# Patient Record
Sex: Female | Born: 1950
Health system: Southern US, Community
[De-identification: ages and names within clinical notes are randomized; demographics above are authoritative.]

## PROBLEM LIST (undated history)

## (undated) DIAGNOSIS — G8929 Other chronic pain: Secondary | ICD-10-CM

## (undated) DIAGNOSIS — M545 Low back pain, unspecified: Secondary | ICD-10-CM

## (undated) DIAGNOSIS — E7800 Pure hypercholesterolemia, unspecified: Secondary | ICD-10-CM

## (undated) DIAGNOSIS — R21 Rash and other nonspecific skin eruption: Secondary | ICD-10-CM

## (undated) DIAGNOSIS — R011 Cardiac murmur, unspecified: Secondary | ICD-10-CM

## (undated) DIAGNOSIS — Z8489 Family history of other specified conditions: Secondary | ICD-10-CM

## (undated) DIAGNOSIS — F329 Major depressive disorder, single episode, unspecified: Secondary | ICD-10-CM

## (undated) DIAGNOSIS — IMO0001 Reserved for inherently not codable concepts without codable children: Secondary | ICD-10-CM

## (undated) DIAGNOSIS — M1990 Unspecified osteoarthritis, unspecified site: Secondary | ICD-10-CM

## (undated) DIAGNOSIS — J329 Chronic sinusitis, unspecified: Secondary | ICD-10-CM

## (undated) DIAGNOSIS — M48061 Spinal stenosis, lumbar region without neurogenic claudication: Secondary | ICD-10-CM

## (undated) DIAGNOSIS — J449 Chronic obstructive pulmonary disease, unspecified: Secondary | ICD-10-CM

## (undated) DIAGNOSIS — K219 Gastro-esophageal reflux disease without esophagitis: Secondary | ICD-10-CM

## (undated) DIAGNOSIS — F32A Depression, unspecified: Secondary | ICD-10-CM

## (undated) DIAGNOSIS — I499 Cardiac arrhythmia, unspecified: Secondary | ICD-10-CM

## (undated) DIAGNOSIS — G43909 Migraine, unspecified, not intractable, without status migrainosus: Secondary | ICD-10-CM

## (undated) HISTORY — PX: NASAL SINUS SURGERY: SHX719

## (undated) HISTORY — PX: ABDOMINAL HYSTERECTOMY: SHX81

## (undated) HISTORY — PX: WRIST FRACTURE SURGERY: SHX121

## (undated) HISTORY — PX: CARPAL TUNNEL RELEASE: SHX101

## (undated) HISTORY — PX: EYE SURGERY: SHX253

## (undated) HISTORY — PX: FRACTURE SURGERY: SHX138

---

## 2007-03-06 ENCOUNTER — Emergency Department (HOSPITAL_COMMUNITY): Admission: EM | Admit: 2007-03-06 | Discharge: 2007-03-06 | Payer: Self-pay | Admitting: Emergency Medicine

## 2007-04-19 ENCOUNTER — Encounter: Admission: RE | Admit: 2007-04-19 | Discharge: 2007-04-19 | Payer: Self-pay | Admitting: Orthopedic Surgery

## 2007-07-05 ENCOUNTER — Ambulatory Visit: Payer: Self-pay | Admitting: Internal Medicine

## 2007-07-08 ENCOUNTER — Ambulatory Visit: Payer: Self-pay | Admitting: Internal Medicine

## 2009-12-12 ENCOUNTER — Emergency Department: Payer: Self-pay | Admitting: Emergency Medicine

## 2010-09-09 ENCOUNTER — Inpatient Hospital Stay: Payer: Self-pay | Admitting: Unknown Physician Specialty

## 2010-12-10 NOTE — Consult Note (Signed)
Stephanie Powers, Stephanie Powers NO.:  [id number]   MEDICAL RECORD NO.:  [id number]          PATIENT TYPE:  EMS   LOCATION:  ED                           FACILITY:  [facility]   PHYSICIAN:  Madelynn Done, MD  DATE OF BIRTH:  10-02-1950   DATE OF CONSULTATION:  03/06/2007  DATE OF DISCHARGE:                                 CONSULTATION   REQUESTING PHYSICIAN:  Dr. Patrica Duel.   REASON FOR CONSULTATION:  Right displaced distal radius fracture.   HISTORY OF PRESENT ILLNESS:  Stephanie Powers is a 60 year old female who  fell in her home and landed on an outstretched right arm.  She sustained  a closed injury to her right wrist.  The patient presented to the  emergency department with pain and deformity to the right wrist.  I was  consulted for the management of her right wrist fracture.   PAST MEDICAL HISTORY:  She denies any major medical illnesses.   PAST SURGICAL HISTORY:  Bilateral carpal tunnel surgery.   MEDICATIONS:  None listed.   ALLERGIES:  CIPRO.   SOCIAL HISTORY:  She is a 1 pack-a-day smoker.  She lives alone.  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   REVIEW OF SYSTEMS:  No recent illnesses, hospitalizations.   PHYSICAL EXAMINATION:  She is a healthy-appearing white female.  Awake,  alert and oriented to person, place and time.  Temperature of 98.7, heart rate of 88, respirations 18, blood pressure  118/77.  She is in no acute distress.  RIGHT WRIST:  She has obvious deformity to her right wrist.  She has  tenderness to palpation of her distal radius.  No pain with passive  motion of her elbow.  She is able to extend her thumb, make the A-okay  sign, cross her fingers, abduct and adduct the digits.  Her fingertips  are warm, well-perfused.  Good cap refill.  She has a well-healed carpal  tunnel incision.  Sensation light to light touch is present in the tips  of her digits.  No warmth or redness.  No scars on the dorsum of her  hand.   RADIOGRAPHS:  AP, lateral, and oblique films of the right wrist do show  a displaced and comminuted, what appears to be mainly extraarticular  distal radius fracture.  There is some intra-articular extension  dorsally.   IMPRESSION:  Right wrist displaced distal radius fracture.   PROCEDURE:  The patient received intravenous pain medicine and then the  right wrist was anesthetized with a 1% lidocaine hematoma block, 10 mL.  The patient tolerated this well.  Using the finger trap traction, 10  pounds a closed manipulation was performed.  The patient was placed in  well-padded sugar-tong splint.  The patient tolerated this well.   Post reduction radiographs do show improvement in the radial height and  inclination,  and as well as the volar tilt.  She still appears somewhat  ulnar-positive.  We will have to get comparison films.   PLAN:  The patient's wrist was rather unstable.  There were 2-3 attempts  at the manipulation.  She continued to fall off dorsally.  I think there  is an acceptable reduction in the splint today, however, I think it is  an unstable injury, is likely going to require operative intervention.  I think  she would be a candidate for an external fixator versus a  percutaneous skeletal fixation, and possible open reduction and internal  fixation.  The patient will be discharged to home.  She will be  contacted and followed up in the office this week for surgical planning.  Continue with ice and elevation.  No use of the right upper extremity.  Smoking cessation was encouraged encourage.  Vicodin medicine was given  for pain.  She has never had a bone density test.  This is something we  are going to have to follow her during her postoperative care about bone  density and osteoporosis, given the distal radius an insufficiency  fracture.      Madelynn Done, MD  Electronically Signed     FWO/MEDQ  D:  03/06/2007  T:  03/08/2007  Job:  [id number]

## 2011-05-12 LAB — COMPREHENSIVE METABOLIC PANEL
ALT: 24
AST: 23
Albumin: 3.7
Alkaline Phosphatase: 95
Chloride: 107
GFR calc Af Amer: >60
Potassium: 3.7
Sodium: 142
Total Bilirubin: 0.4
Total Protein: 6.6

## 2011-05-12 LAB — DIFFERENTIAL
Basophils Relative: 0
Eosinophils Absolute: 0.1
Eosinophils Relative: 1
Monocytes Absolute: 0.6
Monocytes Relative: 5
Neutro Abs: 7.9 — ABNORMAL HIGH

## 2011-05-12 LAB — APTT: aPTT: 29

## 2011-05-12 LAB — CBC
Platelets: 260
RDW: 13.5
WBC: 10.9 — ABNORMAL HIGH

## 2015-01-15 ENCOUNTER — Encounter (INDEPENDENT_AMBULATORY_CARE_PROVIDER_SITE_OTHER): Payer: Medicare Other | Admitting: Ophthalmology

## 2015-01-15 DIAGNOSIS — H25012 Cortical age-related cataract, left eye: Secondary | ICD-10-CM | POA: Diagnosis not present

## 2015-01-15 DIAGNOSIS — H35373 Puckering of macula, bilateral: Secondary | ICD-10-CM | POA: Diagnosis not present

## 2015-01-15 DIAGNOSIS — H26491 Other secondary cataract, right eye: Secondary | ICD-10-CM | POA: Diagnosis not present

## 2015-01-15 DIAGNOSIS — H43813 Vitreous degeneration, bilateral: Secondary | ICD-10-CM | POA: Diagnosis not present

## 2015-02-05 ENCOUNTER — Other Ambulatory Visit (INDEPENDENT_AMBULATORY_CARE_PROVIDER_SITE_OTHER): Payer: Medicare Other | Admitting: Ophthalmology

## 2015-02-05 DIAGNOSIS — H26491 Other secondary cataract, right eye: Secondary | ICD-10-CM

## 2015-02-12 ENCOUNTER — Ambulatory Visit (INDEPENDENT_AMBULATORY_CARE_PROVIDER_SITE_OTHER): Payer: Medicare Other | Admitting: Ophthalmology

## 2015-02-12 DIAGNOSIS — H2701 Aphakia, right eye: Secondary | ICD-10-CM

## 2015-04-03 ENCOUNTER — Encounter (INDEPENDENT_AMBULATORY_CARE_PROVIDER_SITE_OTHER): Payer: Medicare Other | Admitting: Ophthalmology

## 2015-04-05 NOTE — H&P (Signed)
Stephanie Powers is an 64 y.o. female.   Chief Complaint:painless loss of vision right eye HPI: loss of vision over past year right eye  No past medical history on file.  No past surgical history on file.  No family history on file. Social History:  has no tobacco, alcohol, and drug history on file.  Allergies: Allergies not on file  No prescriptions prior to admission    Review of systems otherwise negative  There were no vitals taken for this visit.  Physical exam: Mental status: oriented x3. Eyes: See eye exam associated with this date of surgery in media tab.  Scanned in by scanning center Ears, Nose, Throat: within normal limits Neck: Within Normal limits General: within normal limits Chest: Within normal limits Breast: deferred Heart: Within normal limits Abdomen: Within normal limits GU: deferred Extremities: within normal limits Skin: within normal limits  Assessment/Plan Preretinal fibrosis right eye Plan: To [facility] for Pars plana vitrectomy, membrane peel, laser, gas injection right eye  Hayden Pedro 04/05/2015, 7:31 AM

## 2015-04-06 ENCOUNTER — Encounter (HOSPITAL_COMMUNITY): Payer: Self-pay | Admitting: Ophthalmology

## 2015-04-06 DIAGNOSIS — H35371 Puckering of macula, right eye: Secondary | ICD-10-CM | POA: Insufficient documentation

## 2015-04-09 ENCOUNTER — Encounter (HOSPITAL_COMMUNITY): Payer: Self-pay | Admitting: *Deleted

## 2015-04-09 NOTE — Progress Notes (Signed)
MS Justice reports no chest pain, gets short of breath with exertion.  Patient having a hard time reading medication bottles, she will bring medications to the hospital with her. Dr Zigmund Daniel instructed her to not bring any medications to the hospital with her.

## 2015-04-10 ENCOUNTER — Encounter (HOSPITAL_COMMUNITY)
Admission: RE | Disposition: A | Discharge status: Home / Self Care | Payer: Self-pay | Source: Ambulatory Visit | Attending: Ophthalmology

## 2015-04-10 ENCOUNTER — Ambulatory Visit (HOSPITAL_COMMUNITY): Payer: Medicare Other | Admitting: Anesthesiology

## 2015-04-10 ENCOUNTER — Encounter (INDEPENDENT_AMBULATORY_CARE_PROVIDER_SITE_OTHER): Payer: Medicare Other | Admitting: Ophthalmology

## 2015-04-10 ENCOUNTER — Ambulatory Visit (HOSPITAL_COMMUNITY)
Admission: RE | Admit: 2015-04-10 | Discharge: 2015-04-11 | Disposition: A | Discharge status: Home / Self Care | Payer: Medicare Other | Source: Ambulatory Visit | Attending: Ophthalmology | Admitting: Ophthalmology

## 2015-04-10 ENCOUNTER — Encounter (HOSPITAL_COMMUNITY): Payer: Self-pay | Admitting: Anesthesiology

## 2015-04-10 DIAGNOSIS — H5060 Mechanical strabismus, unspecified: Secondary | ICD-10-CM | POA: Diagnosis present

## 2015-04-10 DIAGNOSIS — H5461 Unqualified visual loss, right eye, normal vision left eye: Secondary | ICD-10-CM | POA: Diagnosis not present

## 2015-04-10 DIAGNOSIS — H35379 Puckering of macula, unspecified eye: Secondary | ICD-10-CM | POA: Diagnosis present

## 2015-04-10 DIAGNOSIS — H35373 Puckering of macula, bilateral: Secondary | ICD-10-CM

## 2015-04-10 DIAGNOSIS — H43813 Vitreous degeneration, bilateral: Secondary | ICD-10-CM | POA: Diagnosis not present

## 2015-04-10 DIAGNOSIS — H35371 Puckering of macula, right eye: Secondary | ICD-10-CM | POA: Diagnosis not present

## 2015-04-10 HISTORY — DX: Cardiac murmur, unspecified: R01.1

## 2015-04-10 HISTORY — DX: Cardiac arrhythmia, unspecified: I49.9

## 2015-04-10 HISTORY — DX: Chronic obstructive pulmonary disease, unspecified: J44.9

## 2015-04-10 HISTORY — DX: Other chronic pain: G89.29

## 2015-04-10 HISTORY — DX: Migraine, unspecified, not intractable, without status migrainosus: G43.909

## 2015-04-10 HISTORY — PX: GAS/FLUID EXCHANGE: SHX5334

## 2015-04-10 HISTORY — DX: Rash and other nonspecific skin eruption: R21

## 2015-04-10 HISTORY — DX: Gastro-esophageal reflux disease without esophagitis: K21.9

## 2015-04-10 HISTORY — PX: 25 GAUGE PARS PLANA VITRECTOMY WITH 20 GAUGE MVR PORT: SHX6041

## 2015-04-10 HISTORY — DX: Unspecified osteoarthritis, unspecified site: M19.90

## 2015-04-10 HISTORY — DX: Spinal stenosis, lumbar region without neurogenic claudication: M48.061

## 2015-04-10 HISTORY — DX: Pure hypercholesterolemia, unspecified: E78.00

## 2015-04-10 HISTORY — DX: Major depressive disorder, single episode, unspecified: F32.9

## 2015-04-10 HISTORY — DX: Family history of other specified conditions: Z84.89

## 2015-04-10 HISTORY — DX: Reserved for inherently not codable concepts without codable children: IMO0001

## 2015-04-10 HISTORY — DX: Low back pain, unspecified: M54.50

## 2015-04-10 HISTORY — DX: Low back pain: M54.5

## 2015-04-10 HISTORY — PX: LASER PHOTO ABLATION: SHX5942

## 2015-04-10 HISTORY — DX: Chronic sinusitis, unspecified: J32.9

## 2015-04-10 HISTORY — DX: Depression, unspecified: F32.A

## 2015-04-10 HISTORY — PX: MEMBRANE PEEL: SHX5967

## 2015-04-10 LAB — BASIC METABOLIC PANEL
Anion gap: 8 (ref 5–15)
BUN: 9 mg/dL (ref 6–20)
CALCIUM: 9.2 mg/dL (ref 8.9–10.3)
CO2: 25 mmol/L (ref 22–32)
CREATININE: 0.8 mg/dL (ref 0.44–1.00)
Chloride: 107 mmol/L (ref 101–111)
GFR calc Af Amer: >60 mL/min (ref >60)
GFR calc non Af Amer: >60 mL/min (ref >60)
GLUCOSE: 109 mg/dL — AB (ref 65–99)
Potassium: 4.2 mmol/L (ref 3.5–5.1)
Sodium: 140 mmol/L (ref 135–145)

## 2015-04-10 LAB — CBC
HEMATOCRIT: 38.4 % (ref 36.0–46.0)
Hemoglobin: 12.6 g/dL (ref 12.0–15.0)
MCH: 29.7 pg (ref 26.0–34.0)
MCHC: 32.8 g/dL (ref 30.0–36.0)
MCV: 90.6 fL (ref 78.0–100.0)
Platelets: 192 10*3/uL (ref 150–400)
RBC: 4.24 MIL/uL (ref 3.87–5.11)
RDW: 15.1 % (ref 11.5–15.5)
WBC: 7.8 10*3/uL (ref 4.0–10.5)

## 2015-04-10 SURGERY — 25 GAUGE PARS PLANA VITRECTOMY WITH 20 GAUGE MVR PORT
Anesthesia: General | Site: Eye | Laterality: Right

## 2015-04-10 MED ORDER — HYDROCODONE-ACETAMINOPHEN 5-325 MG PO TABS
1.0000 | ORAL_TABLET | ORAL | Status: DC | PRN
  Administered 2015-04-10 (×2): 1 via ORAL
  Administered 2015-04-11: 2 via ORAL
  Filled 2015-04-10: qty 2
  Filled 2015-04-10 (×2): qty 1

## 2015-04-10 MED ORDER — MIDAZOLAM HCL 2 MG/2ML IJ SOLN
INTRAMUSCULAR | Status: AC
  Filled 2015-04-10: qty 4

## 2015-04-10 MED ORDER — ATROPINE SULFATE 1 % OP SOLN
OPHTHALMIC | Status: AC
  Filled 2015-04-10: qty 5

## 2015-04-10 MED ORDER — CEFAZOLIN SODIUM-DEXTROSE 2-3 GM-% IV SOLR
2.0000 g | INTRAVENOUS | Status: AC
  Administered 2015-04-10: 2 g via INTRAVENOUS
  Filled 2015-04-10: qty 50

## 2015-04-10 MED ORDER — LORATADINE 10 MG PO TABS
10.0000 mg | ORAL_TABLET | Freq: Every day | ORAL | Status: DC
  Administered 2015-04-10: 10 mg via ORAL
  Filled 2015-04-10: qty 1

## 2015-04-10 MED ORDER — GATIFLOXACIN 0.5 % OP SOLN
1.0000 [drp] | Freq: Four times a day (QID) | OPHTHALMIC | Status: DC
  Filled 2015-04-10: qty 2.5

## 2015-04-10 MED ORDER — NAPROXEN SODIUM 220 MG PO CAPS: 440.0000 mg | ORAL_CAPSULE | Freq: Two times a day (BID) | ORAL | Status: DC | PRN

## 2015-04-10 MED ORDER — ACETAMINOPHEN 325 MG PO TABS: 325.0000 mg | ORAL_TABLET | ORAL | Status: DC | PRN

## 2015-04-10 MED ORDER — ROCURONIUM BROMIDE 50 MG/5ML IV SOLN
INTRAVENOUS | Status: AC
  Filled 2015-04-10: qty 1

## 2015-04-10 MED ORDER — PHENYLEPHRINE HCL 10 MG/ML IJ SOLN
INTRAMUSCULAR | Status: DC | PRN
  Administered 2015-04-10 (×2): 80 ug via INTRAVENOUS

## 2015-04-10 MED ORDER — ATORVASTATIN CALCIUM 40 MG PO TABS
40.0000 mg | ORAL_TABLET | Freq: Every day | ORAL | Status: DC
  Administered 2015-04-10: 40 mg via ORAL
  Filled 2015-04-10: qty 1

## 2015-04-10 MED ORDER — ALBUTEROL SULFATE (2.5 MG/3ML) 0.083% IN NEBU: 3.0000 mL | INHALATION_SOLUTION | Freq: Four times a day (QID) | RESPIRATORY_TRACT | Status: DC | PRN

## 2015-04-10 MED ORDER — SODIUM HYALURONATE 10 MG/ML IO SOLN
INTRAOCULAR | Status: DC | PRN
  Administered 2015-04-10: 0.85 mL via INTRAOCULAR

## 2015-04-10 MED ORDER — HYDROCODONE-ACETAMINOPHEN 5-325 MG PO TABS: 1.0000 | ORAL_TABLET | Freq: Four times a day (QID) | ORAL | Status: DC | PRN

## 2015-04-10 MED ORDER — PROPOFOL 10 MG/ML IV BOLUS
INTRAVENOUS | Status: AC
  Filled 2015-04-10: qty 20

## 2015-04-10 MED ORDER — BUPIVACAINE HCL (PF) 0.75 % IJ SOLN
INTRAMUSCULAR | Status: DC | PRN
  Administered 2015-04-10: 10 mL

## 2015-04-10 MED ORDER — ACETAMINOPHEN 160 MG/5ML PO SOLN
325.0000 mg | ORAL | Status: DC | PRN
  Filled 2015-04-10: qty 20.3

## 2015-04-10 MED ORDER — SERTRALINE HCL 50 MG PO TABS
125.0000 mg | ORAL_TABLET | Freq: Every day | ORAL | Status: DC
  Administered 2015-04-10: 125 mg via ORAL
  Filled 2015-04-10 (×2): qty 1

## 2015-04-10 MED ORDER — HYPROMELLOSE (GONIOSCOPIC) 2.5 % OP SOLN
OPHTHALMIC | Status: AC
  Filled 2015-04-10: qty 15

## 2015-04-10 MED ORDER — EPINEPHRINE HCL 1 MG/ML IJ SOLN
INTRAMUSCULAR | Status: AC
  Filled 2015-04-10: qty 1

## 2015-04-10 MED ORDER — BSS IO SOLN
INTRAOCULAR | Status: AC
  Filled 2015-04-10: qty 15

## 2015-04-10 MED ORDER — ONDANSETRON HCL 4 MG/2ML IJ SOLN
INTRAMUSCULAR | Status: AC
  Filled 2015-04-10: qty 2

## 2015-04-10 MED ORDER — LIDOCAINE HCL (CARDIAC) 20 MG/ML IV SOLN
INTRAVENOUS | Status: AC
  Filled 2015-04-10: qty 10

## 2015-04-10 MED ORDER — ONDANSETRON HCL 4 MG/2ML IJ SOLN: 4.0000 mg | Freq: Four times a day (QID) | INTRAMUSCULAR | Status: DC | PRN

## 2015-04-10 MED ORDER — TEMAZEPAM 15 MG PO CAPS: 15.0000 mg | ORAL_CAPSULE | Freq: Every evening | ORAL | Status: DC | PRN

## 2015-04-10 MED ORDER — SUCCINYLCHOLINE CHLORIDE 20 MG/ML IJ SOLN
INTRAMUSCULAR | Status: DC | PRN
  Administered 2015-04-10: 40 mg via INTRAVENOUS

## 2015-04-10 MED ORDER — CYCLOPENTOLATE HCL 1 % OP SOLN
1.0000 [drp] | OPHTHALMIC | Status: DC | PRN
  Filled 2015-04-10: qty 2

## 2015-04-10 MED ORDER — PANTOPRAZOLE SODIUM 40 MG PO TBEC
40.0000 mg | DELAYED_RELEASE_TABLET | Freq: Every day | ORAL | Status: DC
  Administered 2015-04-10: 40 mg via ORAL
  Filled 2015-04-10: qty 1

## 2015-04-10 MED ORDER — MIDAZOLAM HCL 5 MG/5ML IJ SOLN
INTRAMUSCULAR | Status: DC | PRN
  Administered 2015-04-10: 2 mg via INTRAVENOUS

## 2015-04-10 MED ORDER — PHENYLEPHRINE HCL 10 % OP SOLN
OPHTHALMIC | Status: AC
  Filled 2015-04-10: qty 5

## 2015-04-10 MED ORDER — GENTAMICIN SULFATE 40 MG/ML IJ SOLN
INTRAMUSCULAR | Status: AC
  Filled 2015-04-10: qty 2

## 2015-04-10 MED ORDER — POLYMYXIN B SULFATE 500000 UNITS IJ SOLR
INTRAMUSCULAR | Status: AC
  Filled 2015-04-10: qty 1

## 2015-04-10 MED ORDER — BACITRACIN-POLYMYXIN B 500-10000 UNIT/GM OP OINT
TOPICAL_OINTMENT | OPHTHALMIC | Status: AC
  Filled 2015-04-10: qty 3.5

## 2015-04-10 MED ORDER — GATIFLOXACIN 0.5 % OP SOLN
1.0000 [drp] | OPHTHALMIC | Status: DC | PRN
  Filled 2015-04-10: qty 2.5

## 2015-04-10 MED ORDER — ACETAZOLAMIDE SODIUM 500 MG IJ SOLR
500.0000 mg | Freq: Once | INTRAMUSCULAR | Status: AC
  Administered 2015-04-11: 500 mg via INTRAVENOUS
  Filled 2015-04-10: qty 500

## 2015-04-10 MED ORDER — FENTANYL CITRATE (PF) 100 MCG/2ML IJ SOLN: 25.0000 ug | INTRAMUSCULAR | Status: DC | PRN

## 2015-04-10 MED ORDER — TRIAMCINOLONE ACETONIDE 0.1 % EX CREA
1.0000 "application " | TOPICAL_CREAM | Freq: Every day | CUTANEOUS | Status: DC
  Filled 2015-04-10: qty 15

## 2015-04-10 MED ORDER — EPINEPHRINE HCL 1 MG/ML IJ SOLN
INTRAOCULAR | Status: DC | PRN
  Administered 2015-04-10: 12:00:00

## 2015-04-10 MED ORDER — HYALURONIDASE HUMAN 150 UNIT/ML IJ SOLN
INTRAMUSCULAR | Status: AC
  Filled 2015-04-10: qty 1

## 2015-04-10 MED ORDER — TRIAMCINOLONE ACETONIDE 40 MG/ML IJ SUSP
INTRAMUSCULAR | Status: AC
  Filled 2015-04-10: qty 5

## 2015-04-10 MED ORDER — SODIUM CHLORIDE 0.9 % IJ SOLN
INTRAMUSCULAR | Status: DC | PRN
  Administered 2015-04-10: 10 mL

## 2015-04-10 MED ORDER — PHENYLEPHRINE HCL 10 % OP SOLN
1.0000 [drp] | OPHTHALMIC | Status: DC
  Filled 2015-04-10: qty 5

## 2015-04-10 MED ORDER — TRAZODONE HCL 50 MG PO TABS
50.0000 mg | ORAL_TABLET | Freq: Every day | ORAL | Status: DC
  Administered 2015-04-10: 50 mg via ORAL
  Filled 2015-04-10: qty 1

## 2015-04-10 MED ORDER — DEXAMETHASONE SODIUM PHOSPHATE 10 MG/ML IJ SOLN
INTRAMUSCULAR | Status: AC
  Filled 2015-04-10: qty 1

## 2015-04-10 MED ORDER — PREDNISOLONE ACETATE 1 % OP SUSP
1.0000 [drp] | Freq: Four times a day (QID) | OPHTHALMIC | Status: DC
  Filled 2015-04-10: qty 5

## 2015-04-10 MED ORDER — TROPICAMIDE 1 % OP SOLN
1.0000 [drp] | OPHTHALMIC | Status: DC | PRN
  Filled 2015-04-10: qty 3

## 2015-04-10 MED ORDER — PROPOFOL 10 MG/ML IV BOLUS
INTRAVENOUS | Status: DC | PRN
  Administered 2015-04-10: 160 mg via INTRAVENOUS

## 2015-04-10 MED ORDER — ROCURONIUM BROMIDE 100 MG/10ML IV SOLN
INTRAVENOUS | Status: DC | PRN
  Administered 2015-04-10: 20 mg via INTRAVENOUS

## 2015-04-10 MED ORDER — LIDOCAINE HCL 2 % IJ SOLN
INTRAMUSCULAR | Status: AC
  Filled 2015-04-10: qty 20

## 2015-04-10 MED ORDER — TROPICAMIDE 1 % OP SOLN
1.0000 [drp] | OPHTHALMIC | Status: AC | PRN
  Administered 2015-04-10 (×3): 1 [drp] via OPHTHALMIC

## 2015-04-10 MED ORDER — SALONPAS ARTHRITIS PAIN RELIEF EX PADS: 1.0000 | MEDICATED_PAD | Freq: Every day | CUTANEOUS | Status: DC | PRN

## 2015-04-10 MED ORDER — BSS IO SOLN
INTRAOCULAR | Status: DC | PRN
  Administered 2015-04-10: 15 mL via INTRAOCULAR

## 2015-04-10 MED ORDER — SODIUM HYALURONATE 10 MG/ML IO SOLN
INTRAOCULAR | Status: AC
  Filled 2015-04-10: qty 0.85

## 2015-04-10 MED ORDER — OXYCODONE HCL 5 MG PO TABS: 5.0000 mg | ORAL_TABLET | Freq: Once | ORAL | Status: DC | PRN

## 2015-04-10 MED ORDER — EPHEDRINE SULFATE 50 MG/ML IJ SOLN
INTRAMUSCULAR | Status: DC | PRN
  Administered 2015-04-10 (×2): 10 mg via INTRAVENOUS

## 2015-04-10 MED ORDER — BSS PLUS IO SOLN
INTRAOCULAR | Status: AC
  Filled 2015-04-10: qty 500

## 2015-04-10 MED ORDER — ONDANSETRON HCL 4 MG/2ML IJ SOLN
INTRAMUSCULAR | Status: DC | PRN
  Administered 2015-04-10: 4 mg via INTRAVENOUS

## 2015-04-10 MED ORDER — DEXAMETHASONE SODIUM PHOSPHATE 10 MG/ML IJ SOLN
INTRAMUSCULAR | Status: DC | PRN
  Administered 2015-04-10: 10 mg

## 2015-04-10 MED ORDER — MAGNESIUM HYDROXIDE 400 MG/5ML PO SUSP: 15.0000 mL | Freq: Four times a day (QID) | ORAL | Status: DC | PRN

## 2015-04-10 MED ORDER — GATIFLOXACIN 0.5 % OP SOLN
1.0000 [drp] | OPHTHALMIC | Status: AC | PRN
  Administered 2015-04-10 (×3): 1 [drp] via OPHTHALMIC

## 2015-04-10 MED ORDER — LATANOPROST 0.005 % OP SOLN
1.0000 [drp] | Freq: Every day | OPHTHALMIC | Status: DC
  Filled 2015-04-10: qty 2.5

## 2015-04-10 MED ORDER — OXYCODONE HCL 5 MG/5ML PO SOLN: 5.0000 mg | Freq: Once | ORAL | Status: DC | PRN

## 2015-04-10 MED ORDER — PHENYLEPHRINE HCL 10 % OP SOLN
1.0000 [drp] | OPHTHALMIC | Status: AC
  Administered 2015-04-10 (×3): 1 [drp] via OPHTHALMIC

## 2015-04-10 MED ORDER — BACITRACIN-POLYMYXIN B 500-10000 UNIT/GM OP OINT
TOPICAL_OINTMENT | OPHTHALMIC | Status: DC | PRN
  Administered 2015-04-10: 1 via OPHTHALMIC

## 2015-04-10 MED ORDER — BRIMONIDINE TARTRATE 0.2 % OP SOLN
1.0000 [drp] | Freq: Two times a day (BID) | OPHTHALMIC | Status: DC
  Filled 2015-04-10: qty 5

## 2015-04-10 MED ORDER — LIDOCAINE HCL (CARDIAC) 20 MG/ML IV SOLN
INTRAVENOUS | Status: DC | PRN
  Administered 2015-04-10: 60 mg via INTRAVENOUS

## 2015-04-10 MED ORDER — PHENYLEPHRINE HCL 2.5 % OP SOLN: 1.0000 [drp] | OPHTHALMIC | Status: DC | PRN

## 2015-04-10 MED ORDER — SODIUM CHLORIDE 0.45 % IV SOLN
INTRAVENOUS | Status: DC
  Administered 2015-04-10: 15:00:00 via INTRAVENOUS

## 2015-04-10 MED ORDER — CYCLOPENTOLATE HCL 1 % OP SOLN
1.0000 [drp] | OPHTHALMIC | Status: AC | PRN
  Administered 2015-04-10 (×3): 1 [drp] via OPHTHALMIC

## 2015-04-10 MED ORDER — SODIUM CHLORIDE 0.9 % IJ SOLN
INTRAMUSCULAR | Status: AC
  Filled 2015-04-10: qty 10

## 2015-04-10 MED ORDER — PHENYLEPHRINE 40 MCG/ML (10ML) SYRINGE FOR IV PUSH (FOR BLOOD PRESSURE SUPPORT)
PREFILLED_SYRINGE | INTRAVENOUS | Status: AC
  Filled 2015-04-10: qty 10

## 2015-04-10 MED ORDER — FENTANYL CITRATE (PF) 250 MCG/5ML IJ SOLN
INTRAMUSCULAR | Status: AC
  Filled 2015-04-10: qty 5

## 2015-04-10 MED ORDER — TETRACAINE HCL 0.5 % OP SOLN
2.0000 [drp] | Freq: Once | OPHTHALMIC | Status: DC
  Filled 2015-04-10: qty 2

## 2015-04-10 MED ORDER — BUPIVACAINE HCL (PF) 0.75 % IJ SOLN
INTRAMUSCULAR | Status: AC
  Filled 2015-04-10: qty 10

## 2015-04-10 MED ORDER — NEOSTIGMINE METHYLSULFATE 10 MG/10ML IV SOLN
INTRAVENOUS | Status: DC | PRN
  Administered 2015-04-10: 3 mg via INTRAVENOUS

## 2015-04-10 MED ORDER — SODIUM CHLORIDE 0.9 % IV SOLN
INTRAVENOUS | Status: DC
  Administered 2015-04-10 (×3): via INTRAVENOUS

## 2015-04-10 MED ORDER — BACITRACIN-POLYMYXIN B 500-10000 UNIT/GM OP OINT
1.0000 "application " | TOPICAL_OINTMENT | Freq: Three times a day (TID) | OPHTHALMIC | Status: DC
  Filled 2015-04-10: qty 3.5

## 2015-04-10 MED ORDER — MORPHINE SULFATE (PF) 2 MG/ML IV SOLN: 1.0000 mg | INTRAVENOUS | Status: DC | PRN

## 2015-04-10 MED ORDER — GLYCOPYRROLATE 0.2 MG/ML IJ SOLN
INTRAMUSCULAR | Status: DC | PRN
  Administered 2015-04-10: 0.4 mg via INTRAVENOUS

## 2015-04-10 MED ORDER — FENTANYL CITRATE (PF) 100 MCG/2ML IJ SOLN
INTRAMUSCULAR | Status: DC | PRN
  Administered 2015-04-10: 50 ug via INTRAVENOUS
  Administered 2015-04-10: 100 ug via INTRAVENOUS

## 2015-04-10 MED ORDER — ATROPINE SULFATE 1 % OP SOLN
OPHTHALMIC | Status: DC | PRN
  Administered 2015-04-10: 1 [drp] via OPHTHALMIC

## 2015-04-10 MED ORDER — NAPROXEN 250 MG PO TABS: 375.0000 mg | ORAL_TABLET | Freq: Two times a day (BID) | ORAL | Status: DC | PRN

## 2015-04-10 SURGICAL SUPPLY — 69 items
APPLICATOR DR MATTHEWS STRL (MISCELLANEOUS) IMPLANT
BLADE EYE CATARACT 19 1.4 BEAV (BLADE) IMPLANT
BLADE MVR KNIFE 19G (BLADE) ×3 IMPLANT
BLADE MVR KNIFE 20G (BLADE) ×3 IMPLANT
CANNULA DUAL BORE 23G (CANNULA) ×3 IMPLANT
CANNULA VLV SOFT TIP 25GA (OPHTHALMIC) ×3 IMPLANT
CORDS BIPOLAR (ELECTRODE) ×3 IMPLANT
COTTONBALL LRG STERILE PKG (GAUZE/BANDAGES/DRESSINGS) ×9 IMPLANT
COVER MAYO STAND STRL (DRAPES) ×3 IMPLANT
DRAPE INCISE 51X51 W/FILM STRL (DRAPES) ×3 IMPLANT
DRAPE OPHTHALMIC 77X100 STRL (CUSTOM PROCEDURE TRAY) ×3 IMPLANT
EAGLE VIT/RET MICRO PIC 168 25 (MISCELLANEOUS) ×3 IMPLANT
FILTER BLUE MILLIPORE (MISCELLANEOUS) ×3 IMPLANT
FILTER STRAW FLUID ASPIR (MISCELLANEOUS) IMPLANT
FORCEPS ECKARDT ILM 25G SERR (OPHTHALMIC RELATED) ×3 IMPLANT
FORCEPS GRIESHABER ILM 25G A (INSTRUMENTS) ×3 IMPLANT
FORCEPS ILM 25G DSP TIP (MISCELLANEOUS) IMPLANT
GLOVE BIO SURGEON STRL SZ8 (GLOVE) ×3 IMPLANT
GLOVE BIOGEL PI IND STRL 6.5 (GLOVE) ×1 IMPLANT
GLOVE BIOGEL PI INDICATOR 6.5 (GLOVE) ×2
GLOVE SS BIOGEL STRL SZ 6.5 (GLOVE) ×1 IMPLANT
GLOVE SS BIOGEL STRL SZ 7 (GLOVE) ×1 IMPLANT
GLOVE SUPERSENSE BIOGEL SZ 6.5 (GLOVE) ×2
GLOVE SUPERSENSE BIOGEL SZ 7 (GLOVE) ×2
GLOVE SURG 8.5 LATEX PF (GLOVE) ×3 IMPLANT
GLOVE SURG SS PI 6.5 STRL IVOR (GLOVE) ×3 IMPLANT
GOWN STRL REUS W/ TWL LRG LVL3 (GOWN DISPOSABLE) ×3 IMPLANT
GOWN STRL REUS W/TWL LRG LVL3 (GOWN DISPOSABLE) ×6
HANDLE PNEUMATIC FOR CONSTEL (OPHTHALMIC) ×3 IMPLANT
KIT BASIN OR (CUSTOM PROCEDURE TRAY) ×3 IMPLANT
KIT ROOM TURNOVER OR (KITS) ×3 IMPLANT
MICROPICK 25G (MISCELLANEOUS) ×3
NEEDLE 18GX1X1/2 (RX/OR ONLY) (NEEDLE) ×3 IMPLANT
NEEDLE 25GX 5/8IN NON SAFETY (NEEDLE) ×3 IMPLANT
NEEDLE 27GAX1X1/2 (NEEDLE) IMPLANT
NEEDLE BACKFLUSH 1281 A1 (NEEDLE) ×3 IMPLANT
NEEDLE FILTER BLUNT 18X 1/2SAF (NEEDLE) ×2
NEEDLE FILTER BLUNT 18X1 1/2 (NEEDLE) ×1 IMPLANT
NEEDLE HYPO 30X.5 LL (NEEDLE) ×6 IMPLANT
NS IRRIG 1000ML POUR BTL (IV SOLUTION) ×3 IMPLANT
PACK VITRECTOMY CUSTOM (CUSTOM PROCEDURE TRAY) ×3 IMPLANT
PAD ARMBOARD 7.5X6 YLW CONV (MISCELLANEOUS) ×6 IMPLANT
PAK PIK VITRECTOMY CVS 25GA (OPHTHALMIC) ×3 IMPLANT
PENCIL BIPOLAR 25GA STR DISP (OPHTHALMIC RELATED) IMPLANT
PIC ILLUMINATED 25G (OPHTHALMIC) ×3
PICK MICROPICK 25G (MISCELLANEOUS) ×1 IMPLANT
PIK ILLUMINATED 25G (OPHTHALMIC) ×1 IMPLANT
PROBE LASER ILLUM FLEX CVD 25G (OPHTHALMIC) ×3 IMPLANT
REPL STRA BRUSH NEEDLE (NEEDLE) ×3 IMPLANT
RESERVOIR BACK FLUSH (MISCELLANEOUS) ×3 IMPLANT
ROLLS DENTAL (MISCELLANEOUS) ×6 IMPLANT
SCRAPER DIAMOND 25GA (OPHTHALMIC RELATED) IMPLANT
SCRAPER DIAMOND DUST MEMBRANE (MISCELLANEOUS) ×3 IMPLANT
SPONGE SURGIFOAM ABS GEL 12-7 (HEMOSTASIS) ×3 IMPLANT
STOPCOCK 4 WAY LG BORE MALE ST (IV SETS) IMPLANT
SUT CHROMIC 7 0 TG140 8 (SUTURE) IMPLANT
SUT ETHILON 10 0 CS140 6 (SUTURE) IMPLANT
SUT ETHILON 9 0 TG140 8 (SUTURE) ×3 IMPLANT
SUT POLY NON ABSORB 10-0 8 STR (SUTURE) IMPLANT
SUT SILK 4 0 RB 1 (SUTURE) IMPLANT
SYR 20CC LL (SYRINGE) ×3 IMPLANT
SYR BULB 3OZ (MISCELLANEOUS) ×3 IMPLANT
SYR TB 1ML LUER SLIP (SYRINGE) ×3 IMPLANT
SYRINGE 10CC LL (SYRINGE) IMPLANT
TOWEL OR 17X24 6PK STRL BLUE (TOWEL DISPOSABLE) ×3 IMPLANT
TOWEL OR 17X26 10 PK STRL BLUE (TOWEL DISPOSABLE) ×3 IMPLANT
TUBING HIGH PRESS EXTEN 6IN (TUBING) IMPLANT
WATER STERILE IRR 1000ML POUR (IV SOLUTION) ×3 IMPLANT
WIPE INSTRUMENT VISIWIPE 73X73 (MISCELLANEOUS) ×3 IMPLANT

## 2015-04-10 NOTE — Anesthesia Postprocedure Evaluation (Signed)
  Anesthesia Post-op Note  Patient: Stephanie Powers  Procedure(s) Performed: Procedure(s) with comments: 25 GAUGE PARS PLANA VITRECTOMY WITH 20 GAUGE MVR PORT (Right) MEMBRANE PEEL (Right) GAS/FLUID EXCHANGE (Right) LASER PHOTO ABLATION (Right) - Head scope laser  Patient Location: PACU  Anesthesia Type:General  Level of Consciousness: awake  Airway and Oxygen Therapy: Patient Spontanous Breathing  Post-op Pain: none  Post-op Assessment: Post-op Vital signs reviewed, Patient's Cardiovascular Status Stable, Respiratory Function Stable, Patent Airway, No signs of Nausea or vomiting and Pain level controlled              Post-op Vital Signs: Reviewed and stable  Last Vitals:  Filed Vitals:   04/10/15 1423  BP: 156/80  Pulse: 87  Temp: 36.8 C  Resp: 20    Complications: No apparent anesthesia complications

## 2015-04-10 NOTE — Brief Op Note (Signed)
Brief Operative note   Preoperative diagnosis:  pre retinal fibrosis right eye Postoperative diagnosis  * No Diagnosis Codes entered *  Procedures: Pars plana vitrectomy, laser, membrane peel, gas injection right eye  Surgeon:  Hayden Pedro, MD...  Assistant:  Deatra Ina SA    Anesthesia: General  Specimen: none  Estimated blood loss:  1cc  Complications: none  Patient sent to PACU in good condition  Composed by Hayden Pedro MD  Dictation number: [id number]

## 2015-04-10 NOTE — H&P (Signed)
I examined the patient today and there is no change in the medical status 

## 2015-04-10 NOTE — Anesthesia Preprocedure Evaluation (Addendum)
Anesthesia Evaluation  Patient identified by MRN, date of birth, ID band Patient awake    Reviewed: Allergy & Precautions, NPO status , Patient's Chart, lab work & pertinent test results  History of Anesthesia Complications Negative for: history of anesthetic complications  Airway Mallampati: II  TM Distance: >3 FB Neck ROM: Full    Dental  (+) Poor Dentition, Missing, Dental Advisory Given,    Pulmonary shortness of breath, COPD,  COPD inhaler, Current Smoker,    breath sounds clear to auscultation       Cardiovascular + dysrhythmias  Rhythm:Regular     Neuro/Psych PSYCHIATRIC DISORDERS Depression negative neurological ROS     GI/Hepatic Neg liver ROS, GERD  Medicated and Poorly Controlled,  Endo/Other  negative endocrine ROS  Renal/GU negative Renal ROS     Musculoskeletal  (+) Arthritis ,   Abdominal   Peds  Hematology   Anesthesia Other Findings   Reproductive/Obstetrics negative OB ROS                            Anesthesia Physical Anesthesia Plan  ASA: III  Anesthesia Plan: General   Post-op Pain Management:    Induction: Intravenous, Cricoid pressure planned and Rapid sequence  Airway Management Planned:   Additional Equipment: None  Intra-op Plan:   Post-operative Plan: Extubation in OR  Informed Consent: I have reviewed the patients History and Physical, chart, labs and discussed the procedure including the risks, benefits and alternatives for the proposed anesthesia with the patient or authorized representative who has indicated his/her understanding and acceptance.   Dental advisory given  Plan Discussed with: CRNA and Surgeon  Anesthesia Plan Comments:         Anesthesia Quick Evaluation

## 2015-04-10 NOTE — Anesthesia Procedure Notes (Signed)
Procedure Name: Intubation Date/Time: 04/10/2015 11:43 AM Performed by: Jenne Campus Pre-anesthesia Checklist: Patient identified, Emergency Drugs available, Suction available, Patient being monitored and Timeout performed Patient Re-evaluated:Patient Re-evaluated prior to inductionOxygen Delivery Method: Circle system utilized Preoxygenation: Pre-oxygenation with 100% oxygen Intubation Type: IV induction, Rapid sequence and Cricoid Pressure applied Laryngoscope Size: Miller and 2 Grade View: Grade I Tube type: Oral Tube size: 7.0 mm Number of attempts: 1 Airway Equipment and Method: Stylet and LTA kit utilized Placement Confirmation: ETT inserted through vocal cords under direct vision,  positive ETCO2,  CO2 detector and breath sounds checked- equal and bilateral Secured at: 21 cm Tube secured with: Tape Dental Injury: Teeth and Oropharynx as per pre-operative assessment

## 2015-04-10 NOTE — Transfer of Care (Signed)
Immediate Anesthesia Transfer of Care Note  Patient: Stephanie Powers  Procedure(s) Performed: Procedure(s) with comments: 25 GAUGE PARS PLANA VITRECTOMY WITH 20 GAUGE MVR PORT (Right) MEMBRANE PEEL (Right) GAS/FLUID EXCHANGE (Right) LASER PHOTO ABLATION (Right) - Head scope laser  Patient Location: PACU  Anesthesia Type:General  Level of Consciousness: awake, oriented and patient cooperative  Airway & Oxygen Therapy: Patient Spontanous Breathing and Patient connected to nasal cannula oxygen  Post-op Assessment: Report given to RN and Post -op Vital signs reviewed and stable  Post vital signs: Reviewed  Last Vitals:  Filed Vitals:   04/10/15 1310  BP: 129/74  Pulse: 96  Temp: 36.9 C  Resp: 20    Complications: No apparent anesthesia complications

## 2015-04-11 ENCOUNTER — Encounter (HOSPITAL_COMMUNITY): Payer: Self-pay | Admitting: Ophthalmology

## 2015-04-11 DIAGNOSIS — H5060 Mechanical strabismus, unspecified: Secondary | ICD-10-CM | POA: Diagnosis not present

## 2015-04-11 MED ORDER — GATIFLOXACIN 0.5 % OP SOLN: 1.0000 [drp] | Freq: Four times a day (QID) | OPHTHALMIC | Status: DC

## 2015-04-11 MED ORDER — BACITRACIN-POLYMYXIN B 500-10000 UNIT/GM OP OINT: 1.0000 "application " | TOPICAL_OINTMENT | Freq: Three times a day (TID) | OPHTHALMIC | Status: DC

## 2015-04-11 MED ORDER — PREDNISOLONE ACETATE 1 % OP SUSP: 1.0000 [drp] | Freq: Four times a day (QID) | OPHTHALMIC | Status: DC

## 2015-04-11 NOTE — Op Note (Signed)
NAMEGARA, Stephanie Powers  MEDICAL RECORD NO.:  [id number]  LOCATION:  6N30C                        FACILITY:  [facility]  PHYSICIAN:  Chrystie Nose. Zigmund Daniel, M.D. DATE OF BIRTH:  [birth year]  DATE OF PROCEDURE:  04/10/2015 DATE OF DISCHARGE:  04/11/2015                              OPERATIVE REPORT   ADMISSION DIAGNOSIS:  Preretinal fibrosis, right eye.  PROCEDURES:  Pars plana vitrectomy, retinal photocoagulation, membrane peel, gas fluid exchange in the right eye.  SURGEON:  Chrystie Nose. Zigmund Daniel, MD  ASSISTANT:  Deatra Ina, SA  ANESTHESIA:  General.  DETAILS:  Usual prep and drape, 25-gauge trocar was placed at 8 and 10 o'clock.  A 3 layered MVR incision 20-gauge was opened at 2 o'clock. The contact lens was anchored into place at 6 and 12 o'clock with 6-0 Vicryl.  Provisc was placed on the corneal surface.  A flat contact lens was placed onto the layer of methylcellulose and onto the cornea.  The pars plana vitrectomy was begun just behind the pseudophakos.  The capsule was opened and capsular edges were removed with vitrectomy. There were dense white membranes in the central vitreous, these were carefully removed under low suction and rapid cutting.  The attention was carried to the macular region where a large white sheet of preretinal fibrosis was seen overlying the macular region.  The diamond- dusted membrane scraper was used to engage the membrane and roll the superior edge toward the center.  The eagle pick was used to lift up on the membrane and peel it from it's attachment to the macula.  The 25- gauge automated forceps were used to grasp membrane at different points and peel it from its attachments to the macular region.  Additional peeling of the membrane which was performed with the diamond-dusted membrane scraper until the entire membrane was removed out to pass the arcade to the equator.  Additional vitrectomy was carried into the  mid periphery where vitreous membranes were encountered and removed.  The 30- gauge prism prismatic viewing was obtained and vitrectomy was carried into the far periphery.  Once the membrane and vitreous was removed, the contact lens ring was removed.  The Biome viewing system was moved into place.  An additional vitrectomy was carried out down to the vitreous base.  The indirect ophthalmoscope laser was moved into place.  639 burns were placed around the retinal periphery.  The power is 400 mW 1000 microns each and 0.1 seconds each.  A 50% gas fluid exchange was carried out.  The instruments were removed from the eye.  The scleral wound was closed with 1 interrupted 9-0 nylon suture.  The 25-gauge trocars were removed.  The wounds were tested and found to be secured. Polymyxin and gentamicin were irrigated into tenon space.  Atropine solution was applied.  Marcaine was then injected around the globe for postop pain.  Decadron 10 mg was injected into the lower subconjunctival space.  Closing pressure was 10 with a Barraquer tonometer.  Polysporin ophthalmic ointment, a patch and shield were placed.  The patient was awakened and taken to recovery room in satisfactory condition.     Chrystie Nose. Zigmund Daniel, M.D.  JDM/MEDQ  D:  04/10/2015  T:  04/11/2015  Job:  [id number]

## 2015-04-11 NOTE — Progress Notes (Signed)
Stephanie Powers to be D/C'd Home per MD order.  Discussed with the patient and all questions fully answered.  VSS, Skin clean, dry and intact without evidence of skin break down, no evidence of skin tears noted. IV catheter discontinued intact. Site without signs and symptoms of complications. Dressing and pressure applied.  An After Visit Summary was printed and given to the patient.   D/c education completed with patient/family including follow up instructions, medication list, d/c activities limitations if indicated, with other d/c instructions as indicated by MD - patient able to verbalize understanding, all questions fully answered.   Patient instructed to return to ED, call 911, or call MD for any changes in condition.   Patient escorted via [place], and D/C home via private auto.  Theora Master 04/11/2015 1410

## 2015-04-11 NOTE — Progress Notes (Signed)
04/11/2015, 6:26 AM  Mental Status:  Awake, Alert, Oriented  Anterior segment: Cornea  Clear    Anterior Chamber Clear    Lens:    IOL,   Intra Ocular Pressure 09  mmHg with Tonopen  Vitreous: Clear 40%gas bubble   Retina:  Attached Good laser reaction   Impression: Excellent result Retina attached   Final Diagnosis: Active Problems:   Preretinal fibrosis   Plan: start post operative eye drops.  Discharge to home.  Give post operative instructions  Hayden Pedro 04/11/2015, 6:26 AM

## 2015-04-11 NOTE — Discharge Summary (Signed)
Discharge summary not needed on [facility] patients per medical records. 

## 2015-04-18 ENCOUNTER — Inpatient Hospital Stay (INDEPENDENT_AMBULATORY_CARE_PROVIDER_SITE_OTHER): Payer: Medicare Other | Admitting: Ophthalmology

## 2015-04-18 DIAGNOSIS — H35371 Puckering of macula, right eye: Secondary | ICD-10-CM

## 2015-05-07 ENCOUNTER — Encounter (INDEPENDENT_AMBULATORY_CARE_PROVIDER_SITE_OTHER): Payer: Medicare Other | Admitting: Ophthalmology

## 2015-05-07 DIAGNOSIS — H35371 Puckering of macula, right eye: Secondary | ICD-10-CM

## 2015-05-09 ENCOUNTER — Encounter (INDEPENDENT_AMBULATORY_CARE_PROVIDER_SITE_OTHER): Payer: Medicare Other | Admitting: Ophthalmology

## 2015-07-06 ENCOUNTER — Ambulatory Visit: Payer: Medicare Other | Admitting: Anesthesiology

## 2015-07-06 ENCOUNTER — Encounter
Admission: RE | Disposition: A | Discharge status: Home / Self Care | Payer: Self-pay | Source: Ambulatory Visit | Attending: Gastroenterology

## 2015-07-06 ENCOUNTER — Ambulatory Visit
Admission: RE | Admit: 2015-07-06 | Discharge: 2015-07-06 | Disposition: A | Discharge status: Home / Self Care | Payer: Medicare Other | Source: Ambulatory Visit | Attending: Gastroenterology | Admitting: Gastroenterology

## 2015-07-06 DIAGNOSIS — K449 Diaphragmatic hernia without obstruction or gangrene: Secondary | ICD-10-CM | POA: Insufficient documentation

## 2015-07-06 DIAGNOSIS — R1310 Dysphagia, unspecified: Secondary | ICD-10-CM | POA: Insufficient documentation

## 2015-07-06 DIAGNOSIS — D123 Benign neoplasm of transverse colon: Secondary | ICD-10-CM | POA: Insufficient documentation

## 2015-07-06 DIAGNOSIS — F329 Major depressive disorder, single episode, unspecified: Secondary | ICD-10-CM | POA: Diagnosis not present

## 2015-07-06 DIAGNOSIS — K219 Gastro-esophageal reflux disease without esophagitis: Secondary | ICD-10-CM | POA: Insufficient documentation

## 2015-07-06 DIAGNOSIS — K2950 Unspecified chronic gastritis without bleeding: Secondary | ICD-10-CM | POA: Diagnosis not present

## 2015-07-06 DIAGNOSIS — K5730 Diverticulosis of large intestine without perforation or abscess without bleeding: Secondary | ICD-10-CM | POA: Diagnosis not present

## 2015-07-06 DIAGNOSIS — Z79899 Other long term (current) drug therapy: Secondary | ICD-10-CM | POA: Diagnosis not present

## 2015-07-06 DIAGNOSIS — M545 Low back pain: Secondary | ICD-10-CM | POA: Diagnosis not present

## 2015-07-06 DIAGNOSIS — J449 Chronic obstructive pulmonary disease, unspecified: Secondary | ICD-10-CM | POA: Diagnosis not present

## 2015-07-06 DIAGNOSIS — E7800 Pure hypercholesterolemia, unspecified: Secondary | ICD-10-CM | POA: Insufficient documentation

## 2015-07-06 DIAGNOSIS — G8929 Other chronic pain: Secondary | ICD-10-CM | POA: Diagnosis not present

## 2015-07-06 DIAGNOSIS — K621 Rectal polyp: Secondary | ICD-10-CM | POA: Insufficient documentation

## 2015-07-06 DIAGNOSIS — R1013 Epigastric pain: Secondary | ICD-10-CM | POA: Diagnosis present

## 2015-07-06 DIAGNOSIS — Z1211 Encounter for screening for malignant neoplasm of colon: Secondary | ICD-10-CM | POA: Insufficient documentation

## 2015-07-06 HISTORY — PX: ESOPHAGOGASTRODUODENOSCOPY (EGD) WITH PROPOFOL: SHX5813

## 2015-07-06 HISTORY — PX: COLONOSCOPY WITH PROPOFOL: SHX5780

## 2015-07-06 SURGERY — COLONOSCOPY WITH PROPOFOL
Anesthesia: General

## 2015-07-06 MED ORDER — GLYCOPYRROLATE 0.2 MG/ML IJ SOLN
INTRAMUSCULAR | Status: DC | PRN
  Administered 2015-07-06: 0.2 mg via INTRAVENOUS

## 2015-07-06 MED ORDER — SODIUM CHLORIDE 0.9 % IV SOLN: INTRAVENOUS | Status: DC

## 2015-07-06 MED ORDER — SODIUM CHLORIDE 0.9 % IV SOLN
INTRAVENOUS | Status: DC
  Administered 2015-07-06: 1000 mL via INTRAVENOUS

## 2015-07-06 MED ORDER — MIDAZOLAM HCL 2 MG/2ML IJ SOLN
INTRAMUSCULAR | Status: DC | PRN
  Administered 2015-07-06: 2 mg via INTRAVENOUS

## 2015-07-06 MED ORDER — FENTANYL CITRATE (PF) 100 MCG/2ML IJ SOLN
INTRAMUSCULAR | Status: DC | PRN
  Administered 2015-07-06 (×2): 50 ug via INTRAVENOUS

## 2015-07-06 MED ORDER — IPRATROPIUM-ALBUTEROL 0.5-2.5 (3) MG/3ML IN SOLN
3.0000 mL | Freq: Once | RESPIRATORY_TRACT | Status: AC
  Administered 2015-07-06: 3 mL via RESPIRATORY_TRACT

## 2015-07-06 MED ORDER — PHENYLEPHRINE HCL 10 MG/ML IJ SOLN
INTRAMUSCULAR | Status: DC | PRN
  Administered 2015-07-06 (×3): 100 ug via INTRAVENOUS

## 2015-07-06 MED ORDER — IPRATROPIUM-ALBUTEROL 0.5-2.5 (3) MG/3ML IN SOLN
RESPIRATORY_TRACT | Status: AC
  Administered 2015-07-06: 3 mL via RESPIRATORY_TRACT
  Filled 2015-07-06: qty 3

## 2015-07-06 MED ORDER — PROPOFOL 500 MG/50ML IV EMUL
INTRAVENOUS | Status: DC | PRN
  Administered 2015-07-06: 85 ug/kg/min via INTRAVENOUS

## 2015-07-06 NOTE — Anesthesia Preprocedure Evaluation (Signed)
Anesthesia Evaluation  Patient identified by MRN, date of birth, ID band Patient awake    Reviewed: Allergy & Precautions, NPO status , Patient's Chart, lab work & pertinent test results  History of Anesthesia Complications (+) Family history of anesthesia reactionNegative for: history of anesthetic complications (mother had CVA with anesthesia)  Airway Mallampati: III       Dental  (+) Missing, Loose   Pulmonary shortness of breath,  COPD inhaler, Current Smoker,           Cardiovascular negative cardio ROS  + dysrhythmias (no tx)      Neuro/Psych Depression negative neurological ROS     GI/Hepatic Neg liver ROS, GERD  Medicated and Poorly Controlled,  Endo/Other  negative endocrine ROS  Renal/GU negative Renal ROS     Musculoskeletal   Abdominal   Peds  Hematology   Anesthesia Other Findings   Reproductive/Obstetrics                             Anesthesia Physical Anesthesia Plan  ASA: III  Anesthesia Plan: General   Post-op Pain Management:    Induction: Intravenous  Airway Management Planned: Nasal Cannula  Additional Equipment:   Intra-op Plan:   Post-operative Plan:   Informed Consent: I have reviewed the patients History and Physical, chart, labs and discussed the procedure including the risks, benefits and alternatives for the proposed anesthesia with the patient or authorized representative who has indicated his/her understanding and acceptance.     Plan Discussed with:   Anesthesia Plan Comments:         Anesthesia Quick Evaluation

## 2015-07-06 NOTE — Op Note (Signed)
[facility] Patient Name: Stephanie Powers Procedure Date: 07/06/2015 11:31 AM MRN: [id number] Account #: [id number] Date of Birth: 04-09-1951 Admit Type: Outpatient Age: 64 Room: [facility] ENDO ROOM 2 Gender: Female Note Status: Finalized Procedure:         Upper GI endoscopy Indications:       Epigastric abdominal pain, Generalized abdominal pain,                     Gastro-esophageal reflux disease Providers:         Lollie Sails, MD Referring MD:      No Local Md, MD (Referring MD) Medicines:         Monitored Anesthesia Care Complications:     No immediate complications. Procedure:         Pre-Anesthesia Assessment:                    - ASA Grade Assessment: III - A patient with severe                     systemic disease.                    After obtaining informed consent, the endoscope was passed                     under direct vision. Throughout the procedure, the                     patient's blood pressure, pulse, and oxygen saturations                     were monitored continuously. The Olympus GIF-160 endoscope                     (S#. [id number]) was introduced through the mouth, and                     advanced to the third part of duodenum. The upper GI                     endoscopy was accomplished without difficulty. The patient                     tolerated the procedure well. Findings:      LA Grade B (one or more mucosal breaks greater than 5 mm, not extending       between the tops of two mucosal folds) esophagitis with no bleeding was       found. Biopsies were taken with a cold forceps for histology.      A small hiatus hernia was present.      Patchy mild inflammation characterized by congestion (edema) and       erythema was found on the greater curvature of the gastric body.      Localized mild inflammation characterized by congestion (edema),       erosions and erythema was found in the prepyloric region of the stomach.        Biopsies were taken with a cold forceps for histology. Biopsies were       taken with a cold forceps for histology.      The examined duodenum was normal. Impression:        - LA Grade B erosive esophagitis. Biopsied.                    -  Small hiatus hernia.                    - Gastritis.                    - Erosive gastritis. Biopsied.                    - Normal examined duodenum. Recommendation:    - Await pathology results.                    - Use Protonix (pantoprazole) 40 mg PO BID for 2 weeks.                    - Use Protonix (pantoprazole) 40 mg PO daily daily. Procedure Code(s): --- Professional ---                    [id number], Esophagogastroduodenoscopy, flexible, transoral;                     with biopsy, single or multiple Diagnosis Code(s): --- Professional ---                    K20.8, Other esophagitis                    K44.9, Diaphragmatic hernia without obstruction or gangrene                    K29.70, Gastritis, unspecified, without bleeding                    K29.60, Other gastritis without bleeding                    R10.13, Epigastric pain                    R10.84, Generalized abdominal pain                    K21.9, Gastro-esophageal reflux disease without esophagitis CPT copyright 2014 American Medical Association. All rights reserved. The codes documented in this report are preliminary and upon coder review may  be revised to meet current compliance requirements. Lollie Sails, MD 07/06/2015 11:55:27 AM This report has been signed electronically. Number of Addenda: 0 Note Initiated On: 07/06/2015 11:31 AM      [facility]

## 2015-07-06 NOTE — Anesthesia Postprocedure Evaluation (Signed)
Anesthesia Post Note  Patient: Stephanie Powers  Procedure(s) Performed: Procedure(s) (LRB): COLONOSCOPY WITH PROPOFOL (N/A) ESOPHAGOGASTRODUODENOSCOPY (EGD) WITH PROPOFOL (N/A)  Patient location during evaluation: PACU Anesthesia Type: General Level of consciousness: awake and alert Pain management: pain level controlled Vital Signs Assessment: post-procedure vital signs reviewed and stable Respiratory status: spontaneous breathing and respiratory function stable Cardiovascular status: blood pressure returned to baseline and stable Anesthetic complications: no    Last Vitals:  Filed Vitals:   07/06/15 1234 07/06/15 1236  BP: 93/56 93/56  Pulse: 93 94  Temp: 36.3 C   Resp: 14 11    Last Pain: There were no vitals filed for this visit.               [name]

## 2015-07-06 NOTE — H&P (Signed)
Outpatient short stay form Pre-procedure 07/06/2015 11:28 AM Lollie Sails MD  Primary Physician: Cyndi Bender PA  Reason for visit:  EGD and colonoscopy  History of present illness:  Patient is a 64 year old female presenting today for her initial screening colonoscopy as well as evaluation of complaint of generalized abdominal pain and diarrhea. She has a history of bad reflux symptoms. Was taking some omeprazole until about 2 months ago which did not seem be helping as much. She was changed to pantoprazole 40 mg once a day which has been of benefit. Does has a history of taking NSAIDs daily.    Current facility-administered medications:  .  0.9 %  sodium chloride infusion, , Intravenous, Continuous, Lollie Sails, MD, Last Rate: 20 mL/hr at 07/06/15 1049, 1,000 mL at 07/06/15 1049 .  0.9 %  sodium chloride infusion, , Intravenous, Continuous, Lollie Sails, MD .  0.9 %  sodium chloride infusion, , Intravenous, Continuous, Lollie Sails, MD  Prescriptions prior to admission  Medication Sig Dispense Refill Last Dose  . albuterol (PROVENTIL HFA;VENTOLIN HFA) 108 (90 BASE) MCG/ACT inhaler Inhale 1-2 puffs into the lungs every 6 (six) hours as needed for wheezing or shortness of breath.   07/05/2015 at Unknown time  . atorvastatin (LIPITOR) 40 MG tablet Take 40 mg by mouth every morning.   07/05/2015 at Unknown time  . bacitracin-polymyxin b (POLYSPORIN) ophthalmic ointment Place 1 application into the right eye 3 (three) times daily. apply to eye every 12 hours while awake 3.5 g 0 07/05/2015 at Unknown time  . cetirizine (ZYRTEC) 10 MG tablet Take 10 mg by mouth every morning.   07/05/2015 at Unknown time  . gatifloxacin (ZYMAXID) 0.5 % SOLN Place 1 drop into the right eye 4 (four) times daily.   07/05/2015 at Unknown time  . HYDROcodone-acetaminophen (NORCO/VICODIN) 5-325 MG per tablet Take 1 tablet by mouth every 6 (six) hours as needed for moderate pain.   07/06/2015 at 0900  .  Liniments (SALONPAS ARTHRITIS PAIN RELIEF) PADS Apply 1 each topically daily as needed.   07/05/2015 at Unknown time  . Naproxen Sodium (ALEVE) 220 MG CAPS Take 440 mg by mouth 2 (two) times daily as needed.   07/05/2015 at Unknown time  . omeprazole (PRILOSEC) 20 MG capsule Take 20 mg by mouth 2 (two) times daily as needed. 20- 40mg    07/05/2015 at Unknown time  . prednisoLONE acetate (PRED FORTE) 1 % ophthalmic suspension Place 1 drop into the right eye 4 (four) times daily. 5 mL 0 07/05/2015 at Unknown time  . sertraline (ZOLOFT) 100 MG tablet Take 125 mg by mouth daily.   07/05/2015 at Unknown time  . traZODone (DESYREL) 50 MG tablet Take 50 mg by mouth at bedtime.   07/05/2015 at Unknown time  . triamcinolone cream (KENALOG) 0.1 % Apply 1 application topically daily.   07/05/2015 at Unknown time  . Triprolidine-Pseudoephedrine (ANTIHISTAMINE PO) Take 1 tablet by mouth at bedtime.   07/05/2015 at Unknown time     Allergies  Allergen Reactions  . Other Other (See Comments)    Allergic to makeup- rash-  Perfume, Hairspray- breathing difficulty and swelling     Past Medical History  Diagnosis Date  . COPD (chronic obstructive pulmonary disease) ([place])   . Dysrhythmia     "nothing to worry about" per MD  . Shortness of breath dyspnea     with exertion  . Depression   . GERD (gastroesophageal reflux disease)   .  Spinal stenosis of lumbar region   . Frequent sinus infections   . Rash of neck     chronic  . Rash     "anywhere it wants to" chronic  . Family history of adverse reaction to anesthesia     Mother had stroke as she came out of anesthesia for back surgery.  (Blood pressure was high  . Hypercholesterolemia   . Heart murmur   . Migraine     "once/month now" (04/10/2015)  . Arthritis     "hands, shoulders, feet, back" (04/10/2015)  . Chronic lower back pain     Review of systems:      Physical Exam    Heart and lungs: Regular rate and rhythm without rub or gallop, lungs are  bilaterally clear    HEENT: Normocephalic atraumatic eyes are anicteric    Other:     Pertinant exam for procedure: Soft mild discomfort to palpation generalized no masses rebound or organomegaly.    Planned proceedures: EGD and colonoscopy with indicated procedures. I have discussed the risks benefits and complications of procedures to include not limited to bleeding, infection, perforation and the risk of sedation and the patient wishes to proceed.    Lollie Sails, MD Gastroenterology 07/06/2015  11:28 AM

## 2015-07-06 NOTE — Transfer of Care (Signed)
Immediate Anesthesia Transfer of Care Note  Patient: Stephanie Powers  Procedure(s) Performed: Procedure(s): COLONOSCOPY WITH PROPOFOL (N/A) ESOPHAGOGASTRODUODENOSCOPY (EGD) WITH PROPOFOL (N/A)  Patient Location: PACU  Anesthesia Type:General  Level of Consciousness: sedated  Airway & Oxygen Therapy: Patient Spontanous Breathing and Patient connected to face mask oxygen  Post-op Assessment: Report given to RN and Post -op Vital signs reviewed and stable  Post vital signs: Reviewed and stable  Last Vitals:  Filed Vitals:   07/06/15 1038  BP: 126/94  Pulse: 94  Temp: 36.8 C  Resp: 16    Complications: No apparent anesthesia complications

## 2015-07-06 NOTE — Op Note (Signed)
[facility] Gastroenterology Patient Name: Stephanie Powers Procedure Date: 07/06/2015 11:30 AM MRN: [id number] Account #: [id number] Date of Birth: 02-Jun-1951 Admit Type: Outpatient Age: 64 Room: [facility] ENDO ROOM 2 Gender: Female Note Status: Finalized Procedure:         Colonoscopy Indications:       Screening for colorectal malignant neoplasm Providers:         Lollie Sails, MD Referring MD:      No Local Md, MD (Referring MD) Medicines:         Monitored Anesthesia Care Complications:     No immediate complications. Procedure:         Pre-Anesthesia Assessment:                    - ASA Grade Assessment: III - A patient with severe                     systemic disease.                    After obtaining informed consent, the colonoscope was                     passed under direct vision. Throughout the procedure, the                     patient's blood pressure, pulse, and oxygen saturations                     were monitored continuously. The Colonoscope was                     introduced through the anus and advanced to the the cecum,                     identified by appendiceal orifice and ileocecal valve. The                     colonoscopy was unusually difficult due to poor bowel                     prep. Successful completion of the procedure was aided by                     lavage. The patient tolerated the procedure well. The                     quality of the bowel preparation was fair. Findings:      Many small and large-mouthed diverticula were found in the sigmoid       colon, in the descending colon, in the transverse colon, in the       ascending colon and in the cecum.      A 3 mm polyp was found in the proximal transverse colon. The polyp was       sessile. The polyp was removed with a cold biopsy forceps. Resection and       retrieval were complete.      Six sessile polyps were found in the rectum. The polyps were 1 to 2 mm       in  size. These polyps were removed with a cold biopsy forceps. Resection       and retrieval were complete.      Biopsies for histology were taken with a cold forceps from  the right       colon and left colon for evaluation of microscopic colitis.      The digital rectal exam was normal. Impression:        - Diverticulosis in the sigmoid colon, in the descending                     colon and in the transverse colon.                    - One 3 mm polyp in the proximal transverse colon.                     Resected and retrieved.                    - Six 1 to 2 mm polyps in the rectum. Resected and                     retrieved.                    - Biopsies were taken with a cold forceps from the right                     colon and left colon for evaluation of microscopic colitis. Recommendation:    - Discharge patient to home.                    - Use Citrucel one tablespoon PO daily daily.                    - Return to GI clinic in 1 month. Procedure Code(s): --- Professional ---                    [phone], Colonoscopy, flexible; with biopsy, single or                     multiple Diagnosis Code(s): --- Professional ---                    Z12.11, Encounter for screening for malignant neoplasm of                     colon                    D12.3, Benign neoplasm of transverse colon                    K62.1, Rectal polyp                    K57.30, Diverticulosis of large intestine without                     perforation or abscess without bleeding CPT copyright 2014 American Medical Association. All rights reserved. The codes documented in this report are preliminary and upon coder review may  be revised to meet current compliance requirements. Lollie Sails, MD 07/06/2015 12:34:41 PM This report has been signed electronically. Number of Addenda: 0 Note Initiated On: 07/06/2015 11:30 AM Scope Withdrawal Time: 0 hours 17 minutes 17 seconds  Total Procedure Duration: 0 hours 32 minutes  34 seconds       [facility]

## 2015-07-07 ENCOUNTER — Encounter: Payer: Self-pay | Admitting: Gastroenterology

## 2015-07-09 LAB — SURGICAL PATHOLOGY

## 2015-07-16 ENCOUNTER — Encounter (INDEPENDENT_AMBULATORY_CARE_PROVIDER_SITE_OTHER): Payer: Medicare Other | Admitting: Ophthalmology

## 2015-07-16 DIAGNOSIS — H43813 Vitreous degeneration, bilateral: Secondary | ICD-10-CM

## 2015-07-16 DIAGNOSIS — H2512 Age-related nuclear cataract, left eye: Secondary | ICD-10-CM | POA: Diagnosis not present

## 2015-07-16 DIAGNOSIS — H35373 Puckering of macula, bilateral: Secondary | ICD-10-CM

## 2015-07-16 DIAGNOSIS — H353111 Nonexudative age-related macular degeneration, right eye, early dry stage: Secondary | ICD-10-CM | POA: Diagnosis not present

## 2015-08-09 DIAGNOSIS — F331 Major depressive disorder, recurrent, moderate: Secondary | ICD-10-CM | POA: Diagnosis not present

## 2015-08-21 DIAGNOSIS — K210 Gastro-esophageal reflux disease with esophagitis: Secondary | ICD-10-CM | POA: Diagnosis not present

## 2015-08-21 DIAGNOSIS — K580 Irritable bowel syndrome with diarrhea: Secondary | ICD-10-CM | POA: Diagnosis not present

## 2015-10-15 DIAGNOSIS — J449 Chronic obstructive pulmonary disease, unspecified: Secondary | ICD-10-CM | POA: Diagnosis not present

## 2015-10-15 DIAGNOSIS — K589 Irritable bowel syndrome without diarrhea: Secondary | ICD-10-CM | POA: Diagnosis not present

## 2015-10-15 DIAGNOSIS — K219 Gastro-esophageal reflux disease without esophagitis: Secondary | ICD-10-CM | POA: Diagnosis not present

## 2015-10-15 DIAGNOSIS — M4806 Spinal stenosis, lumbar region: Secondary | ICD-10-CM | POA: Diagnosis not present

## 2015-10-15 DIAGNOSIS — Z1389 Encounter for screening for other disorder: Secondary | ICD-10-CM | POA: Diagnosis not present

## 2015-10-15 DIAGNOSIS — L309 Dermatitis, unspecified: Secondary | ICD-10-CM | POA: Diagnosis not present

## 2015-10-15 DIAGNOSIS — E7800 Pure hypercholesterolemia, unspecified: Secondary | ICD-10-CM | POA: Diagnosis not present

## 2015-10-15 DIAGNOSIS — Z79899 Other long term (current) drug therapy: Secondary | ICD-10-CM | POA: Diagnosis not present

## 2015-10-15 DIAGNOSIS — K2270 Barrett's esophagus without dysplasia: Secondary | ICD-10-CM | POA: Diagnosis not present

## 2015-10-15 DIAGNOSIS — Z6824 Body mass index (BMI) 24.0-24.9, adult: Secondary | ICD-10-CM | POA: Diagnosis not present

## 2015-11-19 DIAGNOSIS — Z6824 Body mass index (BMI) 24.0-24.9, adult: Secondary | ICD-10-CM | POA: Diagnosis not present

## 2015-11-19 DIAGNOSIS — F329 Major depressive disorder, single episode, unspecified: Secondary | ICD-10-CM | POA: Diagnosis not present

## 2015-11-19 DIAGNOSIS — M4806 Spinal stenosis, lumbar region: Secondary | ICD-10-CM | POA: Diagnosis not present

## 2015-12-19 DIAGNOSIS — J449 Chronic obstructive pulmonary disease, unspecified: Secondary | ICD-10-CM | POA: Diagnosis not present

## 2015-12-19 DIAGNOSIS — J309 Allergic rhinitis, unspecified: Secondary | ICD-10-CM | POA: Diagnosis not present

## 2015-12-19 DIAGNOSIS — F329 Major depressive disorder, single episode, unspecified: Secondary | ICD-10-CM | POA: Diagnosis not present

## 2015-12-19 DIAGNOSIS — Z79899 Other long term (current) drug therapy: Secondary | ICD-10-CM | POA: Diagnosis not present

## 2015-12-19 DIAGNOSIS — Z6824 Body mass index (BMI) 24.0-24.9, adult: Secondary | ICD-10-CM | POA: Diagnosis not present

## 2015-12-19 DIAGNOSIS — M4806 Spinal stenosis, lumbar region: Secondary | ICD-10-CM | POA: Diagnosis not present

## 2016-02-15 DIAGNOSIS — J019 Acute sinusitis, unspecified: Secondary | ICD-10-CM | POA: Diagnosis not present

## 2016-02-15 DIAGNOSIS — R112 Nausea with vomiting, unspecified: Secondary | ICD-10-CM | POA: Diagnosis not present

## 2016-02-19 DIAGNOSIS — R1084 Generalized abdominal pain: Secondary | ICD-10-CM | POA: Diagnosis not present

## 2016-02-19 DIAGNOSIS — R1110 Vomiting, unspecified: Secondary | ICD-10-CM | POA: Diagnosis not present

## 2016-02-19 DIAGNOSIS — R634 Abnormal weight loss: Secondary | ICD-10-CM | POA: Diagnosis not present

## 2016-02-25 ENCOUNTER — Other Ambulatory Visit: Payer: Self-pay | Admitting: Physician Assistant

## 2016-02-25 DIAGNOSIS — R109 Unspecified abdominal pain: Secondary | ICD-10-CM

## 2016-02-25 DIAGNOSIS — R1110 Vomiting, unspecified: Secondary | ICD-10-CM

## 2016-02-27 ENCOUNTER — Ambulatory Visit
Admission: RE | Admit: 2016-02-27 | Discharge: 2016-02-27 | Disposition: A | Discharge status: Home / Self Care | Payer: PPO | Source: Ambulatory Visit | Attending: Physician Assistant | Admitting: Physician Assistant

## 2016-02-27 DIAGNOSIS — R1110 Vomiting, unspecified: Secondary | ICD-10-CM

## 2016-02-27 DIAGNOSIS — R109 Unspecified abdominal pain: Secondary | ICD-10-CM

## 2016-02-27 DIAGNOSIS — R1030 Lower abdominal pain, unspecified: Secondary | ICD-10-CM | POA: Diagnosis not present

## 2016-02-27 MED ORDER — IOPAMIDOL (ISOVUE-300) INJECTION 61%
100.0000 mL | Freq: Once | INTRAVENOUS | Status: AC | PRN
  Administered 2016-02-27: 100 mL via INTRAVENOUS

## 2016-03-27 DIAGNOSIS — J449 Chronic obstructive pulmonary disease, unspecified: Secondary | ICD-10-CM | POA: Diagnosis not present

## 2016-03-27 DIAGNOSIS — E7800 Pure hypercholesterolemia, unspecified: Secondary | ICD-10-CM | POA: Diagnosis not present

## 2016-03-27 DIAGNOSIS — Z9181 History of falling: Secondary | ICD-10-CM | POA: Diagnosis not present

## 2016-03-27 DIAGNOSIS — M4806 Spinal stenosis, lumbar region: Secondary | ICD-10-CM | POA: Diagnosis not present

## 2016-03-27 DIAGNOSIS — F329 Major depressive disorder, single episode, unspecified: Secondary | ICD-10-CM | POA: Diagnosis not present

## 2016-03-27 DIAGNOSIS — Z79899 Other long term (current) drug therapy: Secondary | ICD-10-CM | POA: Diagnosis not present

## 2016-03-27 DIAGNOSIS — Z6822 Body mass index (BMI) 22.0-22.9, adult: Secondary | ICD-10-CM | POA: Diagnosis not present

## 2016-03-27 DIAGNOSIS — K2270 Barrett's esophagus without dysplasia: Secondary | ICD-10-CM | POA: Diagnosis not present

## 2016-05-12 DIAGNOSIS — Z139 Encounter for screening, unspecified: Secondary | ICD-10-CM | POA: Diagnosis not present

## 2016-05-12 DIAGNOSIS — L309 Dermatitis, unspecified: Secondary | ICD-10-CM | POA: Diagnosis not present

## 2016-05-12 DIAGNOSIS — Z6823 Body mass index (BMI) 23.0-23.9, adult: Secondary | ICD-10-CM | POA: Diagnosis not present

## 2016-05-12 DIAGNOSIS — Z23 Encounter for immunization: Secondary | ICD-10-CM | POA: Diagnosis not present

## 2016-06-26 DIAGNOSIS — Z6821 Body mass index (BMI) 21.0-21.9, adult: Secondary | ICD-10-CM | POA: Diagnosis not present

## 2016-06-26 DIAGNOSIS — J441 Chronic obstructive pulmonary disease with (acute) exacerbation: Secondary | ICD-10-CM | POA: Diagnosis not present

## 2016-06-26 DIAGNOSIS — F329 Major depressive disorder, single episode, unspecified: Secondary | ICD-10-CM | POA: Diagnosis not present

## 2016-06-26 DIAGNOSIS — Z79899 Other long term (current) drug therapy: Secondary | ICD-10-CM | POA: Diagnosis not present

## 2016-06-26 DIAGNOSIS — M48061 Spinal stenosis, lumbar region without neurogenic claudication: Secondary | ICD-10-CM | POA: Diagnosis not present

## 2016-06-26 DIAGNOSIS — K2270 Barrett's esophagus without dysplasia: Secondary | ICD-10-CM | POA: Diagnosis not present

## 2016-07-16 ENCOUNTER — Ambulatory Visit (INDEPENDENT_AMBULATORY_CARE_PROVIDER_SITE_OTHER): Payer: PPO | Admitting: Ophthalmology

## 2016-07-16 DIAGNOSIS — H35371 Puckering of macula, right eye: Secondary | ICD-10-CM | POA: Diagnosis not present

## 2016-07-16 DIAGNOSIS — H2512 Age-related nuclear cataract, left eye: Secondary | ICD-10-CM | POA: Diagnosis not present

## 2016-07-16 DIAGNOSIS — H353121 Nonexudative age-related macular degeneration, left eye, early dry stage: Secondary | ICD-10-CM

## 2016-07-16 DIAGNOSIS — H43812 Vitreous degeneration, left eye: Secondary | ICD-10-CM | POA: Diagnosis not present

## 2016-07-16 DIAGNOSIS — H353112 Nonexudative age-related macular degeneration, right eye, intermediate dry stage: Secondary | ICD-10-CM | POA: Diagnosis not present

## 2016-09-04 DIAGNOSIS — H04123 Dry eye syndrome of bilateral lacrimal glands: Secondary | ICD-10-CM | POA: Diagnosis not present

## 2016-09-04 DIAGNOSIS — H40033 Anatomical narrow angle, bilateral: Secondary | ICD-10-CM | POA: Diagnosis not present

## 2016-09-26 DIAGNOSIS — J449 Chronic obstructive pulmonary disease, unspecified: Secondary | ICD-10-CM | POA: Diagnosis not present

## 2016-09-26 DIAGNOSIS — Z1231 Encounter for screening mammogram for malignant neoplasm of breast: Secondary | ICD-10-CM | POA: Diagnosis not present

## 2016-09-26 DIAGNOSIS — Z720 Tobacco use: Secondary | ICD-10-CM | POA: Diagnosis not present

## 2016-09-26 DIAGNOSIS — Z6822 Body mass index (BMI) 22.0-22.9, adult: Secondary | ICD-10-CM | POA: Diagnosis not present

## 2016-09-26 DIAGNOSIS — M48061 Spinal stenosis, lumbar region without neurogenic claudication: Secondary | ICD-10-CM | POA: Diagnosis not present

## 2016-09-26 DIAGNOSIS — Z79899 Other long term (current) drug therapy: Secondary | ICD-10-CM | POA: Diagnosis not present

## 2016-09-26 DIAGNOSIS — M549 Dorsalgia, unspecified: Secondary | ICD-10-CM | POA: Diagnosis not present

## 2016-09-26 DIAGNOSIS — K2270 Barrett's esophagus without dysplasia: Secondary | ICD-10-CM | POA: Diagnosis not present

## 2016-09-26 DIAGNOSIS — F329 Major depressive disorder, single episode, unspecified: Secondary | ICD-10-CM | POA: Diagnosis not present

## 2016-10-10 DIAGNOSIS — Z1231 Encounter for screening mammogram for malignant neoplasm of breast: Secondary | ICD-10-CM | POA: Diagnosis not present

## 2016-10-16 DIAGNOSIS — Z720 Tobacco use: Secondary | ICD-10-CM | POA: Diagnosis not present

## 2016-10-16 DIAGNOSIS — M48061 Spinal stenosis, lumbar region without neurogenic claudication: Secondary | ICD-10-CM | POA: Diagnosis not present

## 2016-10-16 DIAGNOSIS — Z1389 Encounter for screening for other disorder: Secondary | ICD-10-CM | POA: Diagnosis not present

## 2016-10-16 DIAGNOSIS — F329 Major depressive disorder, single episode, unspecified: Secondary | ICD-10-CM | POA: Diagnosis not present

## 2016-10-16 DIAGNOSIS — K589 Irritable bowel syndrome without diarrhea: Secondary | ICD-10-CM | POA: Diagnosis not present

## 2016-10-16 DIAGNOSIS — Z6822 Body mass index (BMI) 22.0-22.9, adult: Secondary | ICD-10-CM | POA: Diagnosis not present

## 2016-11-11 ENCOUNTER — Other Ambulatory Visit: Payer: Self-pay | Admitting: Gastroenterology

## 2016-11-11 DIAGNOSIS — R1310 Dysphagia, unspecified: Secondary | ICD-10-CM

## 2016-11-11 DIAGNOSIS — K2270 Barrett's esophagus without dysplasia: Secondary | ICD-10-CM | POA: Diagnosis not present

## 2016-11-11 DIAGNOSIS — R1314 Dysphagia, pharyngoesophageal phase: Secondary | ICD-10-CM | POA: Diagnosis not present

## 2016-11-11 DIAGNOSIS — K580 Irritable bowel syndrome with diarrhea: Secondary | ICD-10-CM | POA: Diagnosis not present

## 2016-11-17 ENCOUNTER — Ambulatory Visit
Admission: RE | Admit: 2016-11-17 | Discharge: 2016-11-17 | Disposition: A | Discharge status: Home / Self Care | Payer: PPO | Source: Ambulatory Visit | Attending: Gastroenterology | Admitting: Gastroenterology

## 2016-11-17 DIAGNOSIS — K449 Diaphragmatic hernia without obstruction or gangrene: Secondary | ICD-10-CM | POA: Insufficient documentation

## 2016-11-17 DIAGNOSIS — R1314 Dysphagia, pharyngoesophageal phase: Secondary | ICD-10-CM | POA: Insufficient documentation

## 2016-11-17 DIAGNOSIS — R1310 Dysphagia, unspecified: Secondary | ICD-10-CM

## 2016-11-17 DIAGNOSIS — I999 Unspecified disorder of circulatory system: Secondary | ICD-10-CM | POA: Insufficient documentation

## 2016-11-24 DIAGNOSIS — M48062 Spinal stenosis, lumbar region with neurogenic claudication: Secondary | ICD-10-CM | POA: Diagnosis not present

## 2016-12-09 DIAGNOSIS — M48062 Spinal stenosis, lumbar region with neurogenic claudication: Secondary | ICD-10-CM | POA: Diagnosis not present

## 2016-12-29 DIAGNOSIS — M48062 Spinal stenosis, lumbar region with neurogenic claudication: Secondary | ICD-10-CM | POA: Diagnosis not present

## 2016-12-30 DIAGNOSIS — F329 Major depressive disorder, single episode, unspecified: Secondary | ICD-10-CM | POA: Diagnosis not present

## 2016-12-30 DIAGNOSIS — Z6822 Body mass index (BMI) 22.0-22.9, adult: Secondary | ICD-10-CM | POA: Diagnosis not present

## 2016-12-30 DIAGNOSIS — M48061 Spinal stenosis, lumbar region without neurogenic claudication: Secondary | ICD-10-CM | POA: Diagnosis not present

## 2016-12-30 DIAGNOSIS — M5416 Radiculopathy, lumbar region: Secondary | ICD-10-CM | POA: Diagnosis not present

## 2017-01-02 DIAGNOSIS — M5416 Radiculopathy, lumbar region: Secondary | ICD-10-CM | POA: Diagnosis not present

## 2017-01-22 ENCOUNTER — Other Ambulatory Visit: Payer: Self-pay | Admitting: Orthopedic Surgery

## 2017-01-22 DIAGNOSIS — M5416 Radiculopathy, lumbar region: Secondary | ICD-10-CM

## 2017-02-09 ENCOUNTER — Ambulatory Visit
Admission: RE | Admit: 2017-02-09 | Discharge: 2017-02-09 | Disposition: A | Discharge status: Home / Self Care | Payer: PPO | Source: Ambulatory Visit | Attending: Orthopedic Surgery | Admitting: Orthopedic Surgery

## 2017-02-09 DIAGNOSIS — M5126 Other intervertebral disc displacement, lumbar region: Secondary | ICD-10-CM | POA: Diagnosis not present

## 2017-02-09 DIAGNOSIS — M5416 Radiculopathy, lumbar region: Secondary | ICD-10-CM

## 2017-02-13 DIAGNOSIS — M48062 Spinal stenosis, lumbar region with neurogenic claudication: Secondary | ICD-10-CM | POA: Diagnosis not present

## 2017-03-05 ENCOUNTER — Other Ambulatory Visit: Payer: Self-pay | Admitting: Orthopedic Surgery

## 2017-03-16 ENCOUNTER — Encounter (HOSPITAL_COMMUNITY)
Admission: RE | Admit: 2017-03-16 | Discharge: 2017-03-16 | Disposition: A | Discharge status: Home / Self Care | Payer: PPO | Source: Ambulatory Visit | Attending: Orthopedic Surgery | Admitting: Orthopedic Surgery

## 2017-03-16 ENCOUNTER — Ambulatory Visit (HOSPITAL_COMMUNITY)
Admission: RE | Admit: 2017-03-16 | Discharge: 2017-03-16 | Disposition: A | Discharge status: Home / Self Care | Payer: PPO | Source: Ambulatory Visit | Attending: Orthopedic Surgery | Admitting: Orthopedic Surgery

## 2017-03-16 ENCOUNTER — Encounter (HOSPITAL_COMMUNITY): Payer: Self-pay

## 2017-03-16 DIAGNOSIS — K449 Diaphragmatic hernia without obstruction or gangrene: Secondary | ICD-10-CM | POA: Diagnosis not present

## 2017-03-16 DIAGNOSIS — M545 Low back pain: Secondary | ICD-10-CM | POA: Diagnosis present

## 2017-03-16 DIAGNOSIS — F17210 Nicotine dependence, cigarettes, uncomplicated: Secondary | ICD-10-CM | POA: Diagnosis present

## 2017-03-16 DIAGNOSIS — M7138 Other bursal cyst, other site: Secondary | ICD-10-CM | POA: Diagnosis not present

## 2017-03-16 DIAGNOSIS — M1990 Unspecified osteoarthritis, unspecified site: Secondary | ICD-10-CM | POA: Diagnosis present

## 2017-03-16 DIAGNOSIS — M79605 Pain in left leg: Secondary | ICD-10-CM | POA: Diagnosis present

## 2017-03-16 DIAGNOSIS — G8929 Other chronic pain: Secondary | ICD-10-CM | POA: Diagnosis present

## 2017-03-16 DIAGNOSIS — K2270 Barrett's esophagus without dysplasia: Secondary | ICD-10-CM | POA: Diagnosis not present

## 2017-03-16 DIAGNOSIS — K2930 Chronic superficial gastritis without bleeding: Secondary | ICD-10-CM | POA: Diagnosis not present

## 2017-03-16 DIAGNOSIS — J449 Chronic obstructive pulmonary disease, unspecified: Secondary | ICD-10-CM | POA: Diagnosis present

## 2017-03-16 DIAGNOSIS — Z01818 Encounter for other preprocedural examination: Secondary | ICD-10-CM

## 2017-03-16 DIAGNOSIS — K224 Dyskinesia of esophagus: Secondary | ICD-10-CM | POA: Diagnosis not present

## 2017-03-16 DIAGNOSIS — Z01812 Encounter for preprocedural laboratory examination: Secondary | ICD-10-CM

## 2017-03-16 DIAGNOSIS — M4326 Fusion of spine, lumbar region: Secondary | ICD-10-CM | POA: Diagnosis not present

## 2017-03-16 DIAGNOSIS — M48062 Spinal stenosis, lumbar region with neurogenic claudication: Secondary | ICD-10-CM | POA: Diagnosis not present

## 2017-03-16 DIAGNOSIS — M79604 Pain in right leg: Secondary | ICD-10-CM

## 2017-03-16 DIAGNOSIS — Z91048 Other nonmedicinal substance allergy status: Secondary | ICD-10-CM | POA: Diagnosis not present

## 2017-03-16 DIAGNOSIS — E7800 Pure hypercholesterolemia, unspecified: Secondary | ICD-10-CM | POA: Diagnosis not present

## 2017-03-16 DIAGNOSIS — M5416 Radiculopathy, lumbar region: Secondary | ICD-10-CM | POA: Diagnosis present

## 2017-03-16 DIAGNOSIS — Z01810 Encounter for preprocedural cardiovascular examination: Secondary | ICD-10-CM | POA: Insufficient documentation

## 2017-03-16 DIAGNOSIS — K210 Gastro-esophageal reflux disease with esophagitis: Secondary | ICD-10-CM | POA: Diagnosis not present

## 2017-03-16 DIAGNOSIS — F329 Major depressive disorder, single episode, unspecified: Secondary | ICD-10-CM | POA: Diagnosis present

## 2017-03-16 DIAGNOSIS — K219 Gastro-esophageal reflux disease without esophagitis: Secondary | ICD-10-CM | POA: Diagnosis present

## 2017-03-16 DIAGNOSIS — Z79899 Other long term (current) drug therapy: Secondary | ICD-10-CM | POA: Diagnosis not present

## 2017-03-16 DIAGNOSIS — M4316 Spondylolisthesis, lumbar region: Secondary | ICD-10-CM | POA: Diagnosis present

## 2017-03-16 DIAGNOSIS — R1310 Dysphagia, unspecified: Secondary | ICD-10-CM | POA: Diagnosis present

## 2017-03-16 DIAGNOSIS — M48061 Spinal stenosis, lumbar region without neurogenic claudication: Secondary | ICD-10-CM | POA: Diagnosis present

## 2017-03-16 DIAGNOSIS — K2950 Unspecified chronic gastritis without bleeding: Secondary | ICD-10-CM | POA: Diagnosis not present

## 2017-03-16 LAB — CBC WITH DIFFERENTIAL/PLATELET
BASOS ABS: 0 10*3/uL (ref 0.0–0.1)
BASOS PCT: 0 %
EOS ABS: 0.1 10*3/uL (ref 0.0–0.7)
EOS PCT: 1 %
HCT: 37.4 % (ref 36.0–46.0)
Hemoglobin: 12 g/dL (ref 12.0–15.0)
LYMPHS PCT: 27 %
Lymphs Abs: 2.3 10*3/uL (ref 0.7–4.0)
MCH: 27.7 pg (ref 26.0–34.0)
MCHC: 32.1 g/dL (ref 30.0–36.0)
MCV: 86.4 fL (ref 78.0–100.0)
Monocytes Absolute: 0.4 10*3/uL (ref 0.1–1.0)
Monocytes Relative: 5 %
Neutro Abs: 5.6 10*3/uL (ref 1.7–7.7)
Neutrophils Relative %: 67 %
PLATELETS: 212 10*3/uL (ref 150–400)
RBC: 4.33 MIL/uL (ref 3.87–5.11)
RDW: 14.8 % (ref 11.5–15.5)
WBC: 8.5 10*3/uL (ref 4.0–10.5)

## 2017-03-16 LAB — TYPE AND SCREEN
ABO/RH(D): A POS
ANTIBODY SCREEN: NEGATIVE

## 2017-03-16 LAB — COMPREHENSIVE METABOLIC PANEL
ALBUMIN: 3.8 g/dL (ref 3.5–5.0)
ALT: 13 U/L — AB (ref 14–54)
AST: 18 U/L (ref 15–41)
Alkaline Phosphatase: 79 U/L (ref 38–126)
Anion gap: 7 (ref 5–15)
BUN: 9 mg/dL (ref 6–20)
CHLORIDE: 106 mmol/L (ref 101–111)
CO2: 28 mmol/L (ref 22–32)
CREATININE: 0.88 mg/dL (ref 0.44–1.00)
Calcium: 8.9 mg/dL (ref 8.9–10.3)
GFR calc Af Amer: >60 mL/min (ref >60)
GFR calc non Af Amer: >60 mL/min (ref >60)
GLUCOSE: 89 mg/dL (ref 65–99)
POTASSIUM: 4.2 mmol/L (ref 3.5–5.1)
SODIUM: 141 mmol/L (ref 135–145)
Total Bilirubin: 0.4 mg/dL (ref 0.3–1.2)
Total Protein: 6.5 g/dL (ref 6.5–8.1)

## 2017-03-16 LAB — URINALYSIS, ROUTINE W REFLEX MICROSCOPIC
Bilirubin Urine: NEGATIVE
Glucose, UA: NEGATIVE mg/dL
Ketones, ur: NEGATIVE mg/dL
Leukocytes, UA: NEGATIVE
Nitrite: NEGATIVE
PH: 5 (ref 5.0–8.0)
Protein, ur: NEGATIVE mg/dL
SPECIFIC GRAVITY, URINE: 1.012 (ref 1.005–1.030)

## 2017-03-16 LAB — PROTIME-INR
INR: 0.93
Prothrombin Time: 12.5 seconds (ref 11.4–15.2)

## 2017-03-16 LAB — APTT: APTT: 34 s (ref 24–36)

## 2017-03-16 LAB — SURGICAL PCR SCREEN
MRSA, PCR: NEGATIVE
STAPHYLOCOCCUS AUREUS: NEGATIVE

## 2017-03-16 LAB — ABO/RH: ABO/RH(D): A POS

## 2017-03-16 NOTE — Pre-Procedure Instructions (Signed)
    Stephanie Powers  03/16/2017      [facility] - [place], [place] [address] Phone: [phone] Fax: [phone]    Your procedure is scheduled on 03/19/17.  Report to [facility] Admitting at 7 A.M.  Call this number if you have problems the morning of surgery:  [phone]   Remember:  Do not eat food or drink liquids after midnight.  Take these medicines the morning of surgery with A SIP OF WATER --all inhalers,zyrtec,hydrocodone,protonix,zoloft   Do not wear jewelry, make-up or nail polish.  Do not wear lotions, powders, or perfumes, or deoderant.  Do not shave 48 hours prior to surgery.  Men may shave face and neck.  Do not bring valuables to the hospital.  [facility] is not responsible for any belongings or valuables.  Contacts, dentures or bridgework may not be worn into surgery.  Leave your suitcase in the car.  After surgery it may be brought to your room.  For patients admitted to the hospital, discharge time will be determined by your treatment team.  Patients discharged the day of surgery will not be allowed to drive home.   Name and phone number of your driver:    Special instructions:  Do not take any aspirin,anti-inflammatories,vitamins,or herbal supplements 5-7 days prior to surgery.  Please read over the following fact sheets that you were given. MRSA Information

## 2017-03-17 ENCOUNTER — Ambulatory Visit: Payer: PPO | Admitting: Anesthesiology

## 2017-03-17 ENCOUNTER — Encounter: Payer: Self-pay | Admitting: *Deleted

## 2017-03-17 ENCOUNTER — Ambulatory Visit
Admission: RE | Admit: 2017-03-17 | Discharge: 2017-03-17 | Disposition: A | Discharge status: Home / Self Care | Payer: PPO | Source: Ambulatory Visit | Attending: Gastroenterology | Admitting: Gastroenterology

## 2017-03-17 ENCOUNTER — Encounter
Admission: RE | Disposition: A | Discharge status: Home / Self Care | Payer: Self-pay | Source: Ambulatory Visit | Attending: Gastroenterology

## 2017-03-17 DIAGNOSIS — Z79899 Other long term (current) drug therapy: Secondary | ICD-10-CM | POA: Insufficient documentation

## 2017-03-17 DIAGNOSIS — K224 Dyskinesia of esophagus: Secondary | ICD-10-CM | POA: Insufficient documentation

## 2017-03-17 DIAGNOSIS — F329 Major depressive disorder, single episode, unspecified: Secondary | ICD-10-CM | POA: Diagnosis not present

## 2017-03-17 DIAGNOSIS — M48061 Spinal stenosis, lumbar region without neurogenic claudication: Secondary | ICD-10-CM | POA: Insufficient documentation

## 2017-03-17 DIAGNOSIS — K219 Gastro-esophageal reflux disease without esophagitis: Secondary | ICD-10-CM | POA: Insufficient documentation

## 2017-03-17 DIAGNOSIS — E7800 Pure hypercholesterolemia, unspecified: Secondary | ICD-10-CM | POA: Insufficient documentation

## 2017-03-17 DIAGNOSIS — K449 Diaphragmatic hernia without obstruction or gangrene: Secondary | ICD-10-CM | POA: Insufficient documentation

## 2017-03-17 DIAGNOSIS — K210 Gastro-esophageal reflux disease with esophagitis: Secondary | ICD-10-CM | POA: Diagnosis not present

## 2017-03-17 DIAGNOSIS — K2270 Barrett's esophagus without dysplasia: Secondary | ICD-10-CM | POA: Insufficient documentation

## 2017-03-17 DIAGNOSIS — K2950 Unspecified chronic gastritis without bleeding: Secondary | ICD-10-CM | POA: Diagnosis not present

## 2017-03-17 DIAGNOSIS — J449 Chronic obstructive pulmonary disease, unspecified: Secondary | ICD-10-CM | POA: Insufficient documentation

## 2017-03-17 DIAGNOSIS — R1310 Dysphagia, unspecified: Secondary | ICD-10-CM | POA: Diagnosis not present

## 2017-03-17 HISTORY — PX: ESOPHAGOGASTRODUODENOSCOPY (EGD) WITH PROPOFOL: SHX5813

## 2017-03-17 SURGERY — ESOPHAGOGASTRODUODENOSCOPY (EGD) WITH PROPOFOL
Anesthesia: General

## 2017-03-17 MED ORDER — FENTANYL CITRATE (PF) 100 MCG/2ML IJ SOLN
INTRAMUSCULAR | Status: DC | PRN
  Administered 2017-03-17 (×2): 50 ug via INTRAVENOUS

## 2017-03-17 MED ORDER — LIDOCAINE HCL 2 % EX GEL
CUTANEOUS | Status: AC
  Filled 2017-03-17: qty 5

## 2017-03-17 MED ORDER — PROPOFOL 500 MG/50ML IV EMUL
INTRAVENOUS | Status: DC | PRN
  Administered 2017-03-17: 140 ug/kg/min via INTRAVENOUS

## 2017-03-17 MED ORDER — SODIUM CHLORIDE 0.9 % IV SOLN
INTRAVENOUS | Status: DC
  Administered 2017-03-17: 14:00:00 via INTRAVENOUS
  Administered 2017-03-17: 1000 mL via INTRAVENOUS

## 2017-03-17 MED ORDER — PROPOFOL 500 MG/50ML IV EMUL
INTRAVENOUS | Status: AC
  Filled 2017-03-17: qty 50

## 2017-03-17 MED ORDER — PROPOFOL 10 MG/ML IV BOLUS
INTRAVENOUS | Status: DC | PRN
  Administered 2017-03-17: 100 mg via INTRAVENOUS

## 2017-03-17 MED ORDER — LIDOCAINE 2% (20 MG/ML) 5 ML SYRINGE
INTRAMUSCULAR | Status: DC | PRN
Start: 2017-03-17 — End: 2017-03-17

## 2017-03-17 MED ORDER — MIDAZOLAM HCL 2 MG/2ML IJ SOLN
INTRAMUSCULAR | Status: AC
  Filled 2017-03-17: qty 2

## 2017-03-17 MED ORDER — LIDOCAINE 2% (20 MG/ML) 5 ML SYRINGE
INTRAMUSCULAR | Status: DC | PRN
  Administered 2017-03-17: 30 mg via INTRAVENOUS

## 2017-03-17 MED ORDER — FENTANYL CITRATE (PF) 100 MCG/2ML IJ SOLN
INTRAMUSCULAR | Status: AC
  Filled 2017-03-17: qty 2

## 2017-03-17 MED ORDER — SODIUM CHLORIDE 0.9 % IV SOLN: INTRAVENOUS | Status: DC

## 2017-03-17 MED ORDER — MIDAZOLAM HCL 5 MG/5ML IJ SOLN
INTRAMUSCULAR | Status: DC | PRN
  Administered 2017-03-17 (×2): 1 mg via INTRAVENOUS

## 2017-03-17 NOTE — Transfer of Care (Signed)
Immediate Anesthesia Transfer of Care Note  Patient: Stephanie Powers  Procedure(s) Performed: Procedure(s): ESOPHAGOGASTRODUODENOSCOPY (EGD) WITH PROPOFOL (N/A)  Patient Location: PACU and Endoscopy Unit  Anesthesia Type:General  Level of Consciousness: sedated  Airway & Oxygen Therapy: Patient Spontanous Breathing and Patient connected to nasal cannula oxygen  Post-op Assessment: Report given to RN and Post -op Vital signs reviewed and stable  Post vital signs: Reviewed and stable  Last Vitals:  Vitals:   03/17/17 1230  BP: 126/73  Pulse: 86  Resp: 20  Temp: 36.6 C  SpO2: 98%    Last Pain:  Vitals:   03/17/17 1230  TempSrc: Tympanic         Complications: No apparent anesthesia complications

## 2017-03-17 NOTE — Op Note (Signed)
[facility] Gastroenterology Patient Name: Stephanie Powers Procedure Date: 03/17/2017 1:45 PM MRN: [id number] Account #: [id number] Date of Birth: Apr 25, 1951 Admit Type: Outpatient Age: 66 Room: [facility] ENDO ROOM 3 Gender: Female Note Status: Finalized Procedure:            Upper GI endoscopy Indications:          Dysphagia, Follow-up of Barrett's esophagus Providers:            Lollie Sails, MD Referring MD:         Cyndi Bender (Referring MD) Medicines:            Monitored Anesthesia Care Complications:        No immediate complications. Procedure:            Pre-Anesthesia Assessment:                       - ASA Grade Assessment: III - A patient with severe                        systemic disease.                       After obtaining informed consent, the endoscope was                        passed under direct vision. Throughout the procedure,                        the patient's blood pressure, pulse, and oxygen                        saturations were monitored continuously. The Endoscope                        was introduced through the mouth, and advanced to the                        third part of duodenum. The upper GI endoscopy was                        performed with moderate difficulty due to challenging                        esophageal intubation because of poor visualization.                        Successful completion of the procedure was aided by                        suction. The patient tolerated the procedure well. Findings:      There were esophageal mucosal changes secondary to established       short-segment Barrett's disease present at the gastroesophageal junction       approximately 31 cm from the incisors. The maximum longitudinal extent       of these mucosal changes was 1 cm in length. Mucosa was biopsied with a       cold forceps in a quadrant manner for histology. One specimen bottle was       sent to pathology.      A  medium-sized hiatal hernia was found.  The Z-line was a variable       distance from incisors; the hiatal hernia was sliding.      Diffuse mildly erythematous mucosa without bleeding was found in the       gastric body. Biopsies were taken with a cold forceps for histology.      The examined duodenum was normal.      The cardia and gastric fundus were normal on retroflexion otherwise.      No evidence of esophageal stenosis or stricture. Impression:           - Esophageal mucosal changes secondary to established                        short-segment Barrett's disease. Biopsied.                       - Medium-sized hiatal hernia.                       - Erythematous mucosa in the gastric body. Biopsied.                       - Normal examined duodenum. Recommendation:       - Discharge patient to home.                       - Use Protonix (pantoprazole) 40 mg PO daily daily.                       - Return to GI clinic in 1 month. Procedure Code(s):    --- Professional ---                       [id number], Esophagogastroduodenoscopy, flexible, transoral;                        with biopsy, single or multiple Diagnosis Code(s):    --- Professional ---                       K22.70, Barrett's esophagus without dysplasia                       K44.9, Diaphragmatic hernia without obstruction or                        gangrene                       K31.89, Other diseases of stomach and duodenum                       R13.10, Dysphagia, unspecified CPT copyright 2016 American Medical Association. All rights reserved. The codes documented in this report are preliminary and upon coder review may  be revised to meet current compliance requirements. Lollie Sails, MD 03/17/2017 2:45:09 PM This report has been signed electronically. Number of Addenda: 0 Note Initiated On: 03/17/2017 1:45 PM      [facility]

## 2017-03-17 NOTE — H&P (Signed)
Outpatient short stay form Pre-procedure 03/17/2017 2:01 PM Stephanie Sails MD  Primary Physician: Stephanie Bender PA  Reason for visit:  EGD  History of present illness:  Patient is a 66 year old female presenting today as above. She has a personal history of Barrett's esophagus area also has a complaint of some dysphagia. This is in the cervical region. She does not regurgitate foods. She had a barium swallow done on 11/17/2016 that showed a small hiatal hernia but no significant reflux. The standardized barium tablet passed normally. Patient denies use of any aspirin or blood thinning agents. She does occasionally take an NSAID.    Current Facility-Administered Medications:  .  0.9 %  sodium chloride infusion, , Intravenous, Continuous, Stephanie Sails, MD, Last Rate: 20 mL/hr at 03/17/17 1303 .  0.9 %  sodium chloride infusion, , Intravenous, Continuous, Stephanie Sails, MD  Prescriptions Prior to Admission  Medication Sig Dispense Refill Last Dose  . HYDROcodone-acetaminophen (NORCO) 10-325 MG tablet Take 1 tablet by mouth 2 (two) times daily as needed (pain).    03/17/2017 at 1030  . albuterol (PROVENTIL HFA;VENTOLIN HFA) 108 (90 BASE) MCG/ACT inhaler Inhale 1-2 puffs into the lungs every 6 (six) hours as needed for wheezing or shortness of breath.   07/05/2015 at Unknown time  . atorvastatin (LIPITOR) 40 MG tablet Take 40 mg by mouth every morning.   07/05/2015 at Unknown time  . cetirizine (ZYRTEC) 10 MG tablet Take 20 mg by mouth daily.    07/05/2015 at Unknown time  . cyclobenzaprine (FLEXERIL) 5 MG tablet Take 5 mg by mouth at bedtime.     . dicyclomine (BENTYL) 10 MG capsule Take 20 mg by mouth 2 (two) times daily before a meal.     . Liniments (SALONPAS ARTHRITIS PAIN RELIEF) PADS Apply 1 each topically daily as needed (pain).    07/05/2015 at Unknown time  . Naproxen Sodium (ALEVE) 220 MG CAPS Take 440 mg by mouth daily as needed (pain).    07/05/2015 at Unknown time  .  pantoprazole (PROTONIX) 40 MG tablet Take 40 mg by mouth 2 (two) times daily with a meal.     . Polyvinyl Alcohol-Povidone (REFRESH OP) Place 1 drop into both eyes every 4 (four) hours as needed (dry eyes).     . sertraline (ZOLOFT) 100 MG tablet Take 150 mg by mouth daily.    07/05/2015 at Unknown time  . traZODone (DESYREL) 100 MG tablet Take 100 mg by mouth at bedtime.     . triamcinolone cream (KENALOG) 0.1 % Apply 1 application topically at bedtime as needed (rash).    07/05/2015 at Unknown time  . [DISCONTINUED] bacitracin-polymyxin b (POLYSPORIN) ophthalmic ointment Place 1 application into the right eye 3 (three) times daily. apply to eye every 12 hours while awake 3.5 g 0 07/05/2015 at Unknown time  . [DISCONTINUED] gatifloxacin (ZYMAXID) 0.5 % SOLN Place 1 drop into the right eye 4 (four) times daily.   07/05/2015 at Unknown time  . [DISCONTINUED] prednisoLONE acetate (PRED FORTE) 1 % ophthalmic suspension Place 1 drop into the right eye 4 (four) times daily. 5 mL 0 07/05/2015 at Unknown time     Allergies  Allergen Reactions  . Other Other (See Comments)    Allergic to makeup- rash-  Perfume, Hairspray- breathing difficulty and swelling     Past Medical History:  Diagnosis Date  . Arthritis    "hands, shoulders, feet, back" (04/10/2015)  . Chronic lower back pain   .  COPD (chronic obstructive pulmonary disease) ([place])   . Depression   . Dysrhythmia    "nothing to worry about" per MD  . Family history of adverse reaction to anesthesia    Mother had stroke as she came out of anesthesia for back surgery.  (Blood pressure was high  . Frequent sinus infections   . GERD (gastroesophageal reflux disease)   . Heart murmur   . Hypercholesterolemia   . Migraine    "once/month now" (04/10/2015)  . Rash    "anywhere it wants to" chronic  . Rash of neck    chronic  . Shortness of breath dyspnea    with exertion  . Spinal stenosis of lumbar region     Review of systems:       Physical Exam    Heart and lungs: Regular rate and rhythm without rub or gallop, lungs are bilaterally clear.    HEENT: Normocephalic atraumatic eyes are anicteric    Other:     Pertinant exam for procedure: Soft nontender nondistended bowel sounds positive normoactive.    Planned proceedures: EGD and indicated procedures. I have discussed the risks benefits and complications of procedures to include not limited to bleeding, infection, perforation and the risk of sedation and the patient wishes to proceed.    Stephanie Sails, MD Gastroenterology 03/17/2017  2:01 PM

## 2017-03-17 NOTE — Anesthesia Preprocedure Evaluation (Signed)
Anesthesia Evaluation  Patient identified by MRN, date of birth, ID band Patient awake    Reviewed: Allergy & Precautions, NPO status , Patient's Chart, lab work & pertinent test results  History of Anesthesia Complications (+) Family history of anesthesia reactionNegative for: history of anesthetic complications (mother had CVA with anesthesia)  Airway Mallampati: III       Dental  (+) Missing, Loose, Dental Advidsory Given, Poor Dentition   Pulmonary shortness of breath and with exertion, neg sleep apnea, COPD,  COPD inhaler, neg recent URI, Current Smoker,           Cardiovascular (-) hypertension(-) angina(-) CAD, (-) Past MI, (-) Cardiac Stents and (-) CABG + dysrhythmias (no tx) + Valvular Problems/Murmurs      Neuro/Psych PSYCHIATRIC DISORDERS (Depression) Depression negative neurological ROS     GI/Hepatic Neg liver ROS, GERD  Medicated and Poorly Controlled,  Endo/Other  negative endocrine ROS  Renal/GU negative Renal ROS  negative genitourinary   Musculoskeletal   Abdominal   Peds  Hematology   Anesthesia Other Findings Past Medical History: No date: Arthritis     Comment:  "hands, shoulders, feet, back" (04/10/2015) No date: Chronic lower back pain No date: COPD (chronic obstructive pulmonary disease) ([place]) No date: Depression No date: Dysrhythmia     Comment:  "nothing to worry about" per MD No date: Family history of adverse reaction to anesthesia     Comment:  Mother had stroke as she came out of anesthesia for back              surgery.  (Blood pressure was high No date: Frequent sinus infections No date: GERD (gastroesophageal reflux disease) No date: Heart murmur No date: Hypercholesterolemia No date: Migraine     Comment:  "once/month now" (04/10/2015) No date: Rash     Comment:  "anywhere it wants to" chronic No date: Rash of neck     Comment:  chronic No date: Shortness of breath dyspnea     Comment:  with exertion No date: Spinal stenosis of lumbar region   Reproductive/Obstetrics                             Anesthesia Physical  Anesthesia Plan  ASA: III  Anesthesia Plan: General   Post-op Pain Management:    Induction: Intravenous  PONV Risk Score and Plan: 1 and Propofol infusion  Airway Management Planned: Nasal Cannula  Additional Equipment:   Intra-op Plan:   Post-operative Plan:   Informed Consent: I have reviewed the patients History and Physical, chart, labs and discussed the procedure including the risks, benefits and alternatives for the proposed anesthesia with the patient or authorized representative who has indicated his/her understanding and acceptance.     Plan Discussed with:   Anesthesia Plan Comments:         Anesthesia Quick Evaluation

## 2017-03-17 NOTE — Anesthesia Post-op Follow-up Note (Signed)
Anesthesia QCDR form completed.        

## 2017-03-17 NOTE — Progress Notes (Addendum)
Anesthesia Chart Review: Patient is a 66 year old female scheduled for left sided L4-5 transforaminal lumbar interbody fusion with estimated to an allograft on 03/19/2017 by Dr. Phylliss Bob.  History includes smoking, COPD, hypercholesterolemia, migraines, arthritis, GERD, exertional dyspnea, depression, dysrhythmia (PACs, bigeminy pattern 04/10/15), murmur (told it was slight, never told she needed evaluation such as echo; denied CP, SOB, edema, syncope), chronic neck rash, frequent sinus infections, hysterectomy, nasal sinus surgery. She reports her mother had a CVA after anesthesia and back surgery, but also had high BP.  - Patient had EGD at [facility] (Dr. Loistine Simas) on 03/17/17 that showed esophageal mucosal changes secondary to establish short segment Barrett's disease (biopsied), medium-size hiatal hernia, erythematous mucosa in the gastric body (biopsied).   PCP is Cyndi Bender, PA-C in [place], [place].  Meds include albuterol, Lipitor, Zyrtec, Flexeril, Bentyl, Norco, Salonpas, Protonix, Zoloft, trazodone.  BP 105/86   Pulse 100   Temp 37.1 C   Resp 20   Ht 5\' 1"  (1.549 m)   Wt 120 lb 1.6 oz (54.5 kg)   SpO2 95%   BMI 22.69 kg/m   EKG 03/16/17: NSR.   CXR 03/16/17: IMPRESSION: No active cardiopulmonary disease.  Preoperative labs noted.   I called and spoke with patient. She is feeling well following EGD today. She denied any CV symptoms. Based on currently available information, I anticipate that she can proceed as planned.   George Hugh [facility] Short Stay Center/Anesthesiology Phone [phone] 03/17/2017 6:27 PM

## 2017-03-18 MED ORDER — CEFAZOLIN SODIUM-DEXTROSE 2-4 GM/100ML-% IV SOLN
2.0000 g | INTRAVENOUS | Status: AC
  Administered 2017-03-19: 2 g via INTRAVENOUS
  Filled 2017-03-18: qty 100

## 2017-03-18 NOTE — Anesthesia Postprocedure Evaluation (Signed)
Anesthesia Post Note  Patient: Stephanie Powers  Procedure(s) Performed: Procedure(s) (LRB): ESOPHAGOGASTRODUODENOSCOPY (EGD) WITH PROPOFOL (N/A)  Patient location during evaluation: PACU Anesthesia Type: General Level of consciousness: awake and alert and oriented Pain management: pain level controlled Vital Signs Assessment: post-procedure vital signs reviewed and stable Respiratory status: spontaneous breathing Cardiovascular status: blood pressure returned to baseline Anesthetic complications: no     Last Vitals:  Vitals:   03/17/17 1437 03/17/17 1456  BP: 134/76 135/64  Pulse: 76   Resp: 12   Temp: (!) 35.8 C   SpO2: 96% 97%    Last Pain:  Vitals:   03/18/17 0752  TempSrc:   PainSc: 0-No pain                 Chirstopher Iovino

## 2017-03-19 ENCOUNTER — Inpatient Hospital Stay (HOSPITAL_COMMUNITY): Payer: PPO

## 2017-03-19 ENCOUNTER — Inpatient Hospital Stay (HOSPITAL_COMMUNITY)
Admission: RE | Admit: 2017-03-19 | Discharge: 2017-03-20 | DRG: 455 | Disposition: A | Discharge status: Home Health | Payer: PPO | Source: Ambulatory Visit | Attending: Orthopedic Surgery | Admitting: Orthopedic Surgery

## 2017-03-19 ENCOUNTER — Encounter: Payer: Self-pay | Admitting: Gastroenterology

## 2017-03-19 ENCOUNTER — Inpatient Hospital Stay (HOSPITAL_COMMUNITY): Payer: PPO | Admitting: Certified Registered Nurse Anesthetist

## 2017-03-19 ENCOUNTER — Inpatient Hospital Stay (HOSPITAL_COMMUNITY): Payer: PPO | Admitting: Vascular Surgery

## 2017-03-19 ENCOUNTER — Inpatient Hospital Stay (HOSPITAL_COMMUNITY)
Admission: RE | Disposition: A | Discharge status: Home Health | Payer: Self-pay | Source: Ambulatory Visit | Attending: Orthopedic Surgery

## 2017-03-19 DIAGNOSIS — Z79899 Other long term (current) drug therapy: Secondary | ICD-10-CM

## 2017-03-19 DIAGNOSIS — F329 Major depressive disorder, single episode, unspecified: Secondary | ICD-10-CM | POA: Diagnosis present

## 2017-03-19 DIAGNOSIS — M79605 Pain in left leg: Secondary | ICD-10-CM | POA: Diagnosis present

## 2017-03-19 DIAGNOSIS — M79604 Pain in right leg: Secondary | ICD-10-CM | POA: Diagnosis present

## 2017-03-19 DIAGNOSIS — M4326 Fusion of spine, lumbar region: Secondary | ICD-10-CM | POA: Diagnosis not present

## 2017-03-19 DIAGNOSIS — M5416 Radiculopathy, lumbar region: Secondary | ICD-10-CM | POA: Diagnosis present

## 2017-03-19 DIAGNOSIS — J449 Chronic obstructive pulmonary disease, unspecified: Secondary | ICD-10-CM | POA: Diagnosis present

## 2017-03-19 DIAGNOSIS — M4316 Spondylolisthesis, lumbar region: Secondary | ICD-10-CM | POA: Diagnosis present

## 2017-03-19 DIAGNOSIS — M48061 Spinal stenosis, lumbar region without neurogenic claudication: Secondary | ICD-10-CM | POA: Diagnosis present

## 2017-03-19 DIAGNOSIS — M1990 Unspecified osteoarthritis, unspecified site: Secondary | ICD-10-CM | POA: Diagnosis present

## 2017-03-19 DIAGNOSIS — M545 Low back pain: Secondary | ICD-10-CM | POA: Diagnosis present

## 2017-03-19 DIAGNOSIS — M5410 Radiculopathy, site unspecified: Secondary | ICD-10-CM | POA: Diagnosis present

## 2017-03-19 DIAGNOSIS — F17210 Nicotine dependence, cigarettes, uncomplicated: Secondary | ICD-10-CM | POA: Diagnosis present

## 2017-03-19 DIAGNOSIS — Z91048 Other nonmedicinal substance allergy status: Secondary | ICD-10-CM

## 2017-03-19 DIAGNOSIS — G8929 Other chronic pain: Secondary | ICD-10-CM | POA: Diagnosis present

## 2017-03-19 DIAGNOSIS — K219 Gastro-esophageal reflux disease without esophagitis: Secondary | ICD-10-CM | POA: Diagnosis present

## 2017-03-19 DIAGNOSIS — Z419 Encounter for procedure for purposes other than remedying health state, unspecified: Secondary | ICD-10-CM

## 2017-03-19 LAB — SURGICAL PATHOLOGY

## 2017-03-19 SURGERY — POSTERIOR LUMBAR FUSION 1 LEVEL
Anesthesia: General | Laterality: Left

## 2017-03-19 MED ORDER — DEXTROSE 5 % IV SOLN
INTRAVENOUS | Status: DC | PRN
  Administered 2017-03-19: 25 ug/min via INTRAVENOUS

## 2017-03-19 MED ORDER — FLEET ENEMA 7-19 GM/118ML RE ENEM: 1.0000 | ENEMA | Freq: Once | RECTAL | Status: DC | PRN

## 2017-03-19 MED ORDER — LORATADINE 10 MG PO TABS: 10.0000 mg | ORAL_TABLET | Freq: Every day | ORAL | Status: DC

## 2017-03-19 MED ORDER — MINERAL OIL LIGHT 100 % EX OIL
TOPICAL_OIL | CUTANEOUS | Status: AC
  Filled 2017-03-19: qty 25

## 2017-03-19 MED ORDER — SALONPAS ARTHRITIS PAIN RELIEF EX PADS: 1.0000 | MEDICATED_PAD | Freq: Every day | CUTANEOUS | Status: DC | PRN

## 2017-03-19 MED ORDER — OXYCODONE-ACETAMINOPHEN 5-325 MG PO TABS
1.0000 | ORAL_TABLET | ORAL | Status: DC | PRN
  Administered 2017-03-19 – 2017-03-20 (×3): 2 via ORAL
  Filled 2017-03-19 (×3): qty 2

## 2017-03-19 MED ORDER — ALBUMIN HUMAN 5 % IV SOLN
INTRAVENOUS | Status: AC
  Filled 2017-03-19: qty 250

## 2017-03-19 MED ORDER — PANTOPRAZOLE SODIUM 40 MG PO TBEC: 40.0000 mg | DELAYED_RELEASE_TABLET | Freq: Two times a day (BID) | ORAL | Status: DC

## 2017-03-19 MED ORDER — MIDAZOLAM HCL 2 MG/2ML IJ SOLN
INTRAMUSCULAR | Status: AC
  Filled 2017-03-19: qty 2

## 2017-03-19 MED ORDER — POTASSIUM CHLORIDE IN NACL 20-0.9 MEQ/L-% IV SOLN: INTRAVENOUS | Status: DC

## 2017-03-19 MED ORDER — ALBUMIN HUMAN 5 % IV SOLN
INTRAVENOUS | Status: DC | PRN
  Administered 2017-03-19: 12:00:00 via INTRAVENOUS

## 2017-03-19 MED ORDER — BISACODYL 5 MG PO TBEC: 5.0000 mg | DELAYED_RELEASE_TABLET | Freq: Every day | ORAL | Status: DC | PRN

## 2017-03-19 MED ORDER — PROPOFOL 10 MG/ML IV BOLUS
INTRAVENOUS | Status: DC | PRN
  Administered 2017-03-19: 20 mg via INTRAVENOUS
  Administered 2017-03-19: 30 mg via INTRAVENOUS
  Administered 2017-03-19: 100 mg via INTRAVENOUS

## 2017-03-19 MED ORDER — PANTOPRAZOLE SODIUM 40 MG IV SOLR
40.0000 mg | Freq: Every day | INTRAVENOUS | Status: DC
  Administered 2017-03-19: 40 mg via INTRAVENOUS
  Filled 2017-03-19: qty 40

## 2017-03-19 MED ORDER — LIDOCAINE HCL (CARDIAC) 20 MG/ML IV SOLN
INTRAVENOUS | Status: DC | PRN
  Administered 2017-03-19: 40 mg via INTRAVENOUS

## 2017-03-19 MED ORDER — EPHEDRINE SULFATE 50 MG/ML IJ SOLN
INTRAMUSCULAR | Status: DC | PRN
  Administered 2017-03-19: 5 mg via INTRAVENOUS
  Administered 2017-03-19: 15 mg via INTRAVENOUS
  Administered 2017-03-19 (×2): 5 mg via INTRAVENOUS

## 2017-03-19 MED ORDER — ONDANSETRON HCL 4 MG/2ML IJ SOLN
INTRAMUSCULAR | Status: DC | PRN
  Administered 2017-03-19: 4 mg via INTRAVENOUS

## 2017-03-19 MED ORDER — SODIUM CHLORIDE 0.9% FLUSH: 3.0000 mL | INTRAVENOUS | Status: DC | PRN

## 2017-03-19 MED ORDER — ALBUTEROL SULFATE (2.5 MG/3ML) 0.083% IN NEBU: 3.0000 mL | INHALATION_SOLUTION | Freq: Four times a day (QID) | RESPIRATORY_TRACT | Status: DC | PRN

## 2017-03-19 MED ORDER — FENTANYL CITRATE (PF) 250 MCG/5ML IJ SOLN
INTRAMUSCULAR | Status: AC
  Filled 2017-03-19: qty 5

## 2017-03-19 MED ORDER — ALUM & MAG HYDROXIDE-SIMETH 200-200-20 MG/5ML PO SUSP: 30.0000 mL | Freq: Four times a day (QID) | ORAL | Status: DC | PRN

## 2017-03-19 MED ORDER — DIAZEPAM 5 MG PO TABS
5.0000 mg | ORAL_TABLET | Freq: Four times a day (QID) | ORAL | Status: DC | PRN
  Administered 2017-03-19 – 2017-03-20 (×3): 5 mg via ORAL
  Filled 2017-03-19 (×2): qty 1

## 2017-03-19 MED ORDER — OXYCODONE HCL 5 MG PO TABS
5.0000 mg | ORAL_TABLET | Freq: Once | ORAL | Status: AC | PRN
  Administered 2017-03-19: 5 mg via ORAL

## 2017-03-19 MED ORDER — ARTIFICIAL TEARS OPHTHALMIC OINT
TOPICAL_OINTMENT | OPHTHALMIC | Status: AC
  Filled 2017-03-19: qty 3.5

## 2017-03-19 MED ORDER — ONDANSETRON HCL 4 MG/2ML IJ SOLN
INTRAMUSCULAR | Status: AC
  Filled 2017-03-19: qty 2

## 2017-03-19 MED ORDER — MENTHOL 3 MG MT LOZG: 1.0000 | LOZENGE | OROMUCOSAL | Status: DC | PRN

## 2017-03-19 MED ORDER — DICYCLOMINE HCL 10 MG PO CAPS
20.0000 mg | ORAL_CAPSULE | Freq: Two times a day (BID) | ORAL | Status: DC
  Administered 2017-03-20: 20 mg via ORAL
  Filled 2017-03-19: qty 2

## 2017-03-19 MED ORDER — ACETAMINOPHEN 325 MG PO TABS: 650.0000 mg | ORAL_TABLET | ORAL | Status: DC | PRN

## 2017-03-19 MED ORDER — METHYLENE BLUE 0.5 % INJ SOLN
INTRAVENOUS | Status: AC
  Filled 2017-03-19: qty 10

## 2017-03-19 MED ORDER — BUPIVACAINE LIPOSOME 1.3 % IJ SUSP
INTRAMUSCULAR | Status: DC | PRN
  Administered 2017-03-19: 20 mL

## 2017-03-19 MED ORDER — SODIUM CHLORIDE 0.9 % IJ SOLN
INTRAMUSCULAR | Status: AC
  Filled 2017-03-19: qty 10

## 2017-03-19 MED ORDER — SODIUM CHLORIDE 0.9% FLUSH
3.0000 mL | Freq: Two times a day (BID) | INTRAVENOUS | Status: DC
  Administered 2017-03-19: 3 mL via INTRAVENOUS

## 2017-03-19 MED ORDER — THROMBIN 20000 UNITS EX SOLR
CUTANEOUS | Status: AC
  Filled 2017-03-19: qty 20000

## 2017-03-19 MED ORDER — ATORVASTATIN CALCIUM 20 MG PO TABS: 40.0000 mg | ORAL_TABLET | Freq: Every morning | ORAL | Status: DC

## 2017-03-19 MED ORDER — ONDANSETRON HCL 4 MG PO TABS: 4.0000 mg | ORAL_TABLET | Freq: Four times a day (QID) | ORAL | Status: DC | PRN

## 2017-03-19 MED ORDER — ALBUMIN HUMAN 5 % IV SOLN
12.5000 g | Freq: Once | INTRAVENOUS | Status: AC
  Administered 2017-03-19: 12.5 g via INTRAVENOUS

## 2017-03-19 MED ORDER — SERTRALINE HCL 50 MG PO TABS
150.0000 mg | ORAL_TABLET | Freq: Every day | ORAL | Status: DC
  Administered 2017-03-19: 150 mg via ORAL
  Filled 2017-03-19: qty 3

## 2017-03-19 MED ORDER — 0.9 % SODIUM CHLORIDE (POUR BTL) OPTIME
TOPICAL | Status: DC | PRN
  Administered 2017-03-19: 1000 mL

## 2017-03-19 MED ORDER — ROCURONIUM BROMIDE 100 MG/10ML IV SOLN
INTRAVENOUS | Status: DC | PRN
  Administered 2017-03-19: 50 mg via INTRAVENOUS

## 2017-03-19 MED ORDER — PHENYLEPHRINE 40 MCG/ML (10ML) SYRINGE FOR IV PUSH (FOR BLOOD PRESSURE SUPPORT)
PREFILLED_SYRINGE | INTRAVENOUS | Status: AC
  Filled 2017-03-19: qty 10

## 2017-03-19 MED ORDER — ONDANSETRON HCL 4 MG/2ML IJ SOLN: 4.0000 mg | Freq: Once | INTRAMUSCULAR | Status: DC | PRN

## 2017-03-19 MED ORDER — METHYLENE BLUE 0.5 % INJ SOLN
INTRAVENOUS | Status: DC | PRN
  Administered 2017-03-19: .5 mL via SUBMUCOSAL

## 2017-03-19 MED ORDER — ZOLPIDEM TARTRATE 5 MG PO TABS: 5.0000 mg | ORAL_TABLET | Freq: Every evening | ORAL | Status: DC | PRN

## 2017-03-19 MED ORDER — POLYVINYL ALCOHOL 1.4 % OP SOLN
1.0000 [drp] | Freq: Two times a day (BID) | OPHTHALMIC | Status: DC
  Filled 2017-03-19: qty 15

## 2017-03-19 MED ORDER — BUPIVACAINE-EPINEPHRINE 0.25% -1:200000 IJ SOLN
INTRAMUSCULAR | Status: DC | PRN
  Administered 2017-03-19: 10 mL
  Administered 2017-03-19: 20 mL

## 2017-03-19 MED ORDER — SUGAMMADEX SODIUM 200 MG/2ML IV SOLN
INTRAVENOUS | Status: DC | PRN
  Administered 2017-03-19: 100 mg via INTRAVENOUS

## 2017-03-19 MED ORDER — FENTANYL CITRATE (PF) 100 MCG/2ML IJ SOLN
25.0000 ug | INTRAMUSCULAR | Status: DC | PRN
Start: 2017-03-19 — End: 2017-03-19

## 2017-03-19 MED ORDER — ACETAMINOPHEN 650 MG RE SUPP: 650.0000 mg | RECTAL | Status: DC | PRN

## 2017-03-19 MED ORDER — LACTATED RINGERS IV SOLN
INTRAVENOUS | Status: DC
  Administered 2017-03-19 (×2): via INTRAVENOUS

## 2017-03-19 MED ORDER — BUPIVACAINE LIPOSOME 1.3 % IJ SUSP
20.0000 mL | INTRAMUSCULAR | Status: DC
  Filled 2017-03-19: qty 20

## 2017-03-19 MED ORDER — ONDANSETRON HCL 4 MG/2ML IJ SOLN: 4.0000 mg | Freq: Four times a day (QID) | INTRAMUSCULAR | Status: DC | PRN

## 2017-03-19 MED ORDER — DIAZEPAM 5 MG PO TABS
ORAL_TABLET | ORAL | Status: AC
  Filled 2017-03-19: qty 1

## 2017-03-19 MED ORDER — MORPHINE SULFATE (PF) 4 MG/ML IV SOLN: 1.0000 mg | INTRAVENOUS | Status: DC | PRN

## 2017-03-19 MED ORDER — SENNOSIDES-DOCUSATE SODIUM 8.6-50 MG PO TABS: 1.0000 | ORAL_TABLET | Freq: Every evening | ORAL | Status: DC | PRN

## 2017-03-19 MED ORDER — CEFAZOLIN SODIUM-DEXTROSE 2-4 GM/100ML-% IV SOLN
2.0000 g | Freq: Three times a day (TID) | INTRAVENOUS | Status: AC
  Administered 2017-03-19 – 2017-03-20 (×2): 2 g via INTRAVENOUS
  Filled 2017-03-19 (×2): qty 100

## 2017-03-19 MED ORDER — POVIDONE-IODINE 7.5 % EX SOLN: Freq: Once | CUTANEOUS | Status: DC

## 2017-03-19 MED ORDER — OXYCODONE HCL 5 MG/5ML PO SOLN: 5.0000 mg | Freq: Once | ORAL | Status: AC | PRN

## 2017-03-19 MED ORDER — THROMBIN 20000 UNITS EX SOLR
CUTANEOUS | Status: DC | PRN
  Administered 2017-03-19: 20000 [IU] via TOPICAL

## 2017-03-19 MED ORDER — CARBOXYMETHYLCELLULOSE SODIUM 1 % OP SOLN: 1.0000 [drp] | Freq: Two times a day (BID) | OPHTHALMIC | Status: DC

## 2017-03-19 MED ORDER — PROPOFOL 10 MG/ML IV BOLUS
INTRAVENOUS | Status: AC
  Filled 2017-03-19: qty 20

## 2017-03-19 MED ORDER — FENTANYL CITRATE (PF) 100 MCG/2ML IJ SOLN
INTRAMUSCULAR | Status: DC | PRN
  Administered 2017-03-19: 100 ug via INTRAVENOUS
  Administered 2017-03-19: 25 ug via INTRAVENOUS
  Administered 2017-03-19 (×3): 50 ug via INTRAVENOUS
  Administered 2017-03-19: 25 ug via INTRAVENOUS
  Administered 2017-03-19: 50 ug via INTRAVENOUS

## 2017-03-19 MED ORDER — OXYCODONE HCL 5 MG PO TABS
ORAL_TABLET | ORAL | Status: AC
  Filled 2017-03-19: qty 1

## 2017-03-19 MED ORDER — DOCUSATE SODIUM 100 MG PO CAPS
100.0000 mg | ORAL_CAPSULE | Freq: Two times a day (BID) | ORAL | Status: DC
  Administered 2017-03-19: 100 mg via ORAL
  Filled 2017-03-19: qty 1

## 2017-03-19 MED ORDER — PHENOL 1.4 % MT LIQD: 1.0000 | OROMUCOSAL | Status: DC | PRN

## 2017-03-19 MED ORDER — TRAZODONE HCL 100 MG PO TABS
100.0000 mg | ORAL_TABLET | Freq: Every day | ORAL | Status: DC
  Administered 2017-03-19: 100 mg via ORAL
  Filled 2017-03-19 (×2): qty 1

## 2017-03-19 MED ORDER — BUPIVACAINE-EPINEPHRINE (PF) 0.25% -1:200000 IJ SOLN
INTRAMUSCULAR | Status: AC
  Filled 2017-03-19: qty 30

## 2017-03-19 SURGICAL SUPPLY — 87 items
BENZOIN TINCTURE PRP APPL 2/3 (GAUZE/BANDAGES/DRESSINGS) ×3 IMPLANT
BIT DRILL 3.2 (BIT) ×2
BIT DRILL 65X3.2XQC STP NS (BIT) ×1 IMPLANT
BIT DRL 65X3.2XQC STP NS (BIT) ×1
BLADE CLIPPER SURG (BLADE) IMPLANT
BONE VIVIGEN FORMABLE 10CC (Bone Implant) ×3 IMPLANT
BUR PRESCISION 1.7 ELITE (BURR) ×3 IMPLANT
BUR ROUND PRECISION 4.0 (BURR) IMPLANT
BUR ROUND PRECISION 4.0MM (BURR)
BUR SABER RD CUTTING 3.0 (BURR) IMPLANT
BUR SABER RD CUTTING 3.0MM (BURR)
CAGE CONCORDE BULLET 9X9X23 (Cage) ×2 IMPLANT
CAGE SPNL 5D BLT 23X9X9X (Cage) ×1 IMPLANT
CARTRIDGE OIL MAESTRO DRILL (MISCELLANEOUS) ×1 IMPLANT
CLOSURE STERI-STRIP 1/2X4 (GAUZE/BANDAGES/DRESSINGS) ×1
CLOSURE WOUND 1/2 X4 (GAUZE/BANDAGES/DRESSINGS) ×2
CLSR STERI-STRIP ANTIMIC 1/2X4 (GAUZE/BANDAGES/DRESSINGS) ×2 IMPLANT
CONT SPEC 4OZ CLIKSEAL STRL BL (MISCELLANEOUS) ×3 IMPLANT
COVER MAYO STAND STRL (DRAPES) ×6 IMPLANT
COVER SURGICAL LIGHT HANDLE (MISCELLANEOUS) ×3 IMPLANT
DIFFUSER DRILL AIR PNEUMATIC (MISCELLANEOUS) ×3 IMPLANT
DRAIN CHANNEL 15F RND FF W/TCR (WOUND CARE) IMPLANT
DRAPE C-ARM 42X72 X-RAY (DRAPES) ×3 IMPLANT
DRAPE C-ARMOR (DRAPES) IMPLANT
DRAPE POUCH INSTRU U-SHP 10X18 (DRAPES) ×3 IMPLANT
DRAPE SURG 17X23 STRL (DRAPES) ×12 IMPLANT
DURAPREP 26ML APPLICATOR (WOUND CARE) ×3 IMPLANT
ELECT BLADE 4.0 EZ CLEAN MEGAD (MISCELLANEOUS) ×3
ELECT CAUTERY BLADE 6.4 (BLADE) ×3 IMPLANT
ELECT REM PT RETURN 9FT ADLT (ELECTROSURGICAL) ×3
ELECTRODE BLDE 4.0 EZ CLN MEGD (MISCELLANEOUS) ×1 IMPLANT
ELECTRODE REM PT RTRN 9FT ADLT (ELECTROSURGICAL) ×1 IMPLANT
EVACUATOR SILICONE 100CC (DRAIN) IMPLANT
FEE INTRAOP MONITOR IMPULS NCS (MISCELLANEOUS) ×1 IMPLANT
GAUZE SPONGE 4X4 12PLY STRL (GAUZE/BANDAGES/DRESSINGS) ×3 IMPLANT
GAUZE SPONGE 4X4 16PLY XRAY LF (GAUZE/BANDAGES/DRESSINGS) ×3 IMPLANT
GLOVE BIO SURGEON STRL SZ7 (GLOVE) ×3 IMPLANT
GLOVE BIO SURGEON STRL SZ8 (GLOVE) ×3 IMPLANT
GLOVE BIOGEL PI IND STRL 7.0 (GLOVE) ×1 IMPLANT
GLOVE BIOGEL PI IND STRL 8 (GLOVE) ×1 IMPLANT
GLOVE BIOGEL PI INDICATOR 7.0 (GLOVE) ×2
GLOVE BIOGEL PI INDICATOR 8 (GLOVE) ×2
GOWN STRL REUS W/ TWL LRG LVL3 (GOWN DISPOSABLE) ×2 IMPLANT
GOWN STRL REUS W/ TWL XL LVL3 (GOWN DISPOSABLE) ×1 IMPLANT
GOWN STRL REUS W/TWL LRG LVL3 (GOWN DISPOSABLE) ×4
GOWN STRL REUS W/TWL XL LVL3 (GOWN DISPOSABLE) ×2
INTRAOP MONITOR FEE IMPULS NCS (MISCELLANEOUS) ×1
INTRAOP MONITOR FEE IMPULSE (MISCELLANEOUS) ×2
IV CATH 14GX2 1/4 (CATHETERS) ×3 IMPLANT
KIT BASIN OR (CUSTOM PROCEDURE TRAY) ×3 IMPLANT
KIT POSITION SURG JACKSON T1 (MISCELLANEOUS) ×3 IMPLANT
KIT ROOM TURNOVER OR (KITS) ×3 IMPLANT
MARKER SKIN DUAL TIP RULER LAB (MISCELLANEOUS) ×3 IMPLANT
NDL SAFETY ECLIPSE 18X1.5 (NEEDLE) ×1 IMPLANT
NEEDLE 22X1 1/2 (OR ONLY) (NEEDLE) ×6 IMPLANT
NEEDLE HYPO 18GX1.5 SHARP (NEEDLE) ×2
NEEDLE HYPO 25GX1X1/2 BEV (NEEDLE) ×3 IMPLANT
NEEDLE SPNL 18GX3.5 QUINCKE PK (NEEDLE) ×6 IMPLANT
NS IRRIG 1000ML POUR BTL (IV SOLUTION) ×3 IMPLANT
OIL CARTRIDGE MAESTRO DRILL (MISCELLANEOUS) ×3
PACK LAMINECTOMY ORTHO (CUSTOM PROCEDURE TRAY) ×3 IMPLANT
PACK UNIVERSAL I (CUSTOM PROCEDURE TRAY) ×3 IMPLANT
PAD ARMBOARD 7.5X6 YLW CONV (MISCELLANEOUS) ×6 IMPLANT
PATTIES SURGICAL .5 X1 (DISPOSABLE) ×3 IMPLANT
PATTIES SURGICAL .5X1.5 (GAUZE/BANDAGES/DRESSINGS) ×3 IMPLANT
PROBE PEDCLE PROBE MAGSTM DISP (MISCELLANEOUS) ×3 IMPLANT
ROD PRE BENT EXPEDIUM 35MM (Rod) ×6 IMPLANT
SCREW SET SINGLE INNER (Screw) ×12 IMPLANT
SCREW VIPER CORT FIX 6.00X30 (Screw) ×6 IMPLANT
SCREW VIPER CORT FIX 6X35 (Screw) ×6 IMPLANT
SPONGE INTESTINAL PEANUT (DISPOSABLE) ×3 IMPLANT
SPONGE SURGIFOAM ABS GEL 100 (HEMOSTASIS) ×3 IMPLANT
STRIP CLOSURE SKIN 1/2X4 (GAUZE/BANDAGES/DRESSINGS) ×4 IMPLANT
SURGIFLO W/THROMBIN 8M KIT (HEMOSTASIS) IMPLANT
SUT MNCRL AB 4-0 PS2 18 (SUTURE) ×3 IMPLANT
SUT VIC AB 0 CT1 18XCR BRD 8 (SUTURE) ×1 IMPLANT
SUT VIC AB 0 CT1 8-18 (SUTURE) ×2
SUT VIC AB 1 CT1 18XCR BRD 8 (SUTURE) ×1 IMPLANT
SUT VIC AB 1 CT1 8-18 (SUTURE) ×2
SUT VIC AB 2-0 CT2 18 VCP726D (SUTURE) ×3 IMPLANT
SYR 20CC LL (SYRINGE) ×6 IMPLANT
SYR BULB IRRIGATION 50ML (SYRINGE) ×3 IMPLANT
SYR CONTROL 10ML LL (SYRINGE) ×6 IMPLANT
SYR TB 1ML LUER SLIP (SYRINGE) ×3 IMPLANT
TRAY FOLEY W/METER SILVER 16FR (SET/KITS/TRAYS/PACK) ×3 IMPLANT
WATER STERILE IRR 1000ML POUR (IV SOLUTION) ×3 IMPLANT
YANKAUER SUCT BULB TIP NO VENT (SUCTIONS) ×3 IMPLANT

## 2017-03-19 NOTE — Anesthesia Preprocedure Evaluation (Signed)
Anesthesia Evaluation  Patient identified by MRN, date of birth, ID band Patient awake    Reviewed: Allergy & Precautions, NPO status , Patient's Chart, lab work & pertinent test results  Airway Mallampati: II  TM Distance: >3 FB Neck ROM: Full    Dental  (+) Edentulous Upper, Edentulous Lower   Pulmonary Current Smoker,    breath sounds clear to auscultation       Cardiovascular  Rhythm:Regular Rate:Normal     Neuro/Psych    GI/Hepatic   Endo/Other    Renal/GU      Musculoskeletal   Abdominal   Peds  Hematology   Anesthesia Other Findings   Reproductive/Obstetrics                             Anesthesia Physical Anesthesia Plan  ASA: III  Anesthesia Plan: General   Post-op Pain Management:    Induction: Intravenous  PONV Risk Score and Plan: Ondansetron and Dexamethasone  Airway Management Planned: Oral ETT  Additional Equipment:   Intra-op Plan:   Post-operative Plan: Extubation in OR  Informed Consent: I have reviewed the patients History and Physical, chart, labs and discussed the procedure including the risks, benefits and alternatives for the proposed anesthesia with the patient or authorized representative who has indicated his/her understanding and acceptance.     Plan Discussed with: CRNA and Anesthesiologist  Anesthesia Plan Comments:         Anesthesia Quick Evaluation

## 2017-03-19 NOTE — Anesthesia Postprocedure Evaluation (Signed)
Anesthesia Post Note  Patient: Stephanie Powers  Procedure(s) Performed: Procedure(s) (LRB): LEFT SIDED LUMBAR 4-5 TRANSFORAMINAL LUMBAR INTERBODY FUSION WITH INSTRUMENTATION AND ALLOGRAFT (Left)     Patient location during evaluation: PACU Anesthesia Type: General Level of consciousness: awake, awake and alert and oriented Pain management: pain level controlled Vital Signs Assessment: post-procedure vital signs reviewed and stable Respiratory status: spontaneous breathing, nonlabored ventilation and respiratory function stable Cardiovascular status: blood pressure returned to baseline Anesthetic complications: no    Last Vitals:  Vitals:   03/19/17 1540 03/19/17 1545  BP:    Pulse: 93 87  Resp: 13 14  Temp:    SpO2: 97% 99%    Last Pain:  Vitals:   03/19/17 1545  TempSrc:   PainSc: 0-No pain                 Chelsey Kimberley COKER

## 2017-03-19 NOTE — Transfer of Care (Signed)
Immediate Anesthesia Transfer of Care Note  Patient: Stephanie Powers  Procedure(s) Performed: Procedure(s) with comments: LEFT SIDED LUMBAR 4-5 TRANSFORAMINAL LUMBAR INTERBODY FUSION WITH INSTRUMENTATION AND ALLOGRAFT (Left) - LEFT SIDED LUMBAR 4-5 TRANSFORAMINAL LUMBAR INTERBODY FUSION WITH INSTRUMENTATION AND ALLOGRAFT; REQUEST 3.5 HOURS AND FLIP ROOM  Patient Location: PACU  Anesthesia Type:General  Level of Consciousness: awake, alert , oriented and patient cooperative  Airway & Oxygen Therapy: Patient Spontanous Breathing and Patient connected to nasal cannula oxygen  Post-op Assessment: Report given to RN and Post -op Vital signs reviewed and stable   BP 80s/50s;  Fluid bolus started.   Post vital signs: Reviewed and stable  Last Vitals:  Vitals:   03/19/17 0705 03/19/17 1506  BP: 136/65   Pulse: 74 81  Resp: 14 11  Temp: 36.8 C 36.6 C  SpO2: 100% 96%    Last Pain:  Vitals:   03/19/17 0738  TempSrc:   PainSc: 7       Patients Stated Pain Goal: 3 (27/07/86 7544)  Complications: No apparent anesthesia complications

## 2017-03-19 NOTE — Op Note (Signed)
NAME:  Stephanie Powers, Stephanie Powers NO.:  MEDICAL RECORD NO.:  [id number]  PHYSICIAN:  Phylliss Bob, MD           DATE OF BIRTH:  DATE OF PROCEDURE:  03/19/2017                              OPERATIVE REPORT   PREOPERATIVE DIAGNOSES: 1. L4-L5 spinal stenosis. 2. Grade 1 L4-L5 spondylolisthesis. 3. Large left-sided L4-L5 facet cyst, severely compressing the left L5     nerve. 4. Bilateral leg pain.  POSTOPERATIVE DIAGNOSES: 1. L4-L5 spinal stenosis. 2. Grade 1 L4-L5 spondylolisthesis. 3. Large left-sided L4-L5 facet cyst, severely compressing the left L5     nerve. 4. Bilateral leg pain.  PROCEDURE: 1. L4-L5 decompression with bilateral partial facetectomy and     meticulous complex removal of left-sided L4-5 facet cyst, severely     compressing the left L5 nerve.  Of note, the decompression portion     of the procedure did take substantially more work than the fusion     portion of the procedure alone. 2. Left-sided L4-L5 transforaminal lumbar interbody fusion. 3. Right-sided L4-L5 posterolateral fusion. 4. Insertion of interbody device x1 (10 x 23 mm Concorde bullet cage,     lordotic). 5. Placement of posterior instrumentation L4, L5 (6 mm cortical     screws). 6. Use of local autograft. 7. Use of morselized allograft - ViviGen. 8. Intraoperative use of fluoroscopy.  SURGEON:  Phylliss Bob, MD.  ASSISTANTPricilla Holm, PA-C.  ANESTHESIA:  General endotracheal anesthesia.  COMPLICATIONS:  None.  DISPOSITION:  Stable.  ESTIMATED BLOOD LOSS:  Minimal.  INDICATIONS FOR SURGERY:  Briefly, Ms. Dueitt is a pleasant 66 year old female, who did present to me with substantial pain in her bilateral legs, left greater than right.  Her MRI and x-rays did reveal the findings outlined above.  Given the patient's ongoing pain and dysfunction, we did discuss proceeding with the procedure reflected above.  The patient was fully aware of the risks and  limitations of surgery and did elect to proceed.  OPERATIVE DETAILS:  On March 19, 2017, the patient was brought to surgery and general endotracheal anesthesia was administered.  The patient was placed prone on a well-padded flat Jackson bed with a spinal frame.  Antibiotics were given.  The back was prepped and draped and a time-out procedure was performed.  A midline incision was made overlying the L4-L5 intervertebral space.  The fascia was incised at the midline. Liberally using AP and lateral fluoroscopy, I did cannulate the L4 and L5 pedicles using a medial to lateral cortical trajectory technique.  Of note, there was a retrolisthesis across the L3-L4 intervertebral space, and this did make the starting point of the L4 pedicles difficult.  I did need to use a more inferior and lateral starting point than what is typical, in order to avoid entering the L3-L4 facet joints.  On the right side, I did decorticate the facet joints and I did place a 6 x 30 mm screw at L4 and a 6 x 35 mm screw at L5.  A 35 mm rod was placed and distraction was applied across the rod and caps were placed and provisionally tightened.  On the left side, I did place bone wax in the cannulated pedicle holes.  At this point, I performed  a thorough and complete central and bilateral partial facetectomy.  A very prominent left-sided L4-L5 facet cyst was readily encountered.  This was very much adherent to the dura and traversing left L5 nerve.  I did meticulously developed a plane between the facet cyst and the dura and the nerve.  In doing so, I was able to thoroughly decompress the spinal canal and left lateral recess and left L5 nerve.  I was very pleased with the decompression and I was able to accomplish.  The decompression portion of the procedure did take approximately 60 minutes given the meticulous nature of the facet cyst.  Then, with an assistant holding medial retraction of the left L5 nerve, I did  perform an annulotomy at the posterolateral aspect of the L4-L5 intervertebral disk.  I then proceeded with a thorough and complete L4-L5 intervertebral diskectomy, liberally removing multiple disk fragments.  The endplates were then prepared and the intervertebral space was packed with autograft as well as allograft in the form of ViviGen.  The appropriate size interbody spacer was also packed with allograft and autograft and tamped into position in the usual fashion.  I was very pleased with the press-fit of the implant.  The distraction was then released on the contralateral right side.  At this point, screws of the same diameter and length were placed into the L4 and L5 pedicles on the left.  Once again, a 35 mm rod was placed and caps were placed and a final locking procedure was performed on the right and left sides.  I then packed allograft and autograft into the posterolateral gutter on the right side, in order to help aid in the success of the fusion.  I was very pleased with the final AP and lateral fluoroscopic images.  The wound was copiously irrigated prior to placing the bone graft.  The wound was then closed in layers using #1 Vicryl, followed by 2-0 Vicryl, followed by 4-0 Monocryl.  Benzoin and Steri-Strips were applied, followed by sterile dressing.  All instrument counts were correct at the termination of the procedure.  Of note, Pricilla Holm, PA-C, was my assistant throughout surgery, and did aid in retraction, suctioning, and closure from start to finish.     Phylliss Bob, MD     MD/MEDQ  D:  03/19/2017  T:  03/19/2017  Job:  [id number]  cc:   Cyndi Bender, PA

## 2017-03-19 NOTE — H&P (Signed)
PREOPERATIVE H&P  Chief Complaint: Bilateral leg pain  HPI: Stephanie Powers is a 66 y.o. female who presents with ongoing pain in the bilateral legs  MRI reveals severe spinal stenosis at L4/5 with a grade 1 spondylolisthesis also noted.  Patient has failed multiple forms of conservative care and continues to have pain (see office notes for additional details regarding the patient's full course of treatment)  Past Medical History:  Diagnosis Date  . Arthritis    "hands, shoulders, feet, back" (04/10/2015)  . Chronic lower back pain   . COPD (chronic obstructive pulmonary disease) ([place])   . Depression   . Dysrhythmia    "nothing to worry about" per MD  . Family history of adverse reaction to anesthesia    Mother had stroke as she came out of anesthesia for back surgery.  (Blood pressure was high  . Frequent sinus infections   . GERD (gastroesophageal reflux disease)   . Heart murmur   . Hypercholesterolemia   . Migraine    "once/month now" (04/10/2015)  . Rash    "anywhere it wants to" chronic  . Rash of neck    chronic  . Shortness of breath dyspnea    with exertion  . Spinal stenosis of lumbar region    Past Surgical History:  Procedure Laterality Date  . [place] VITRECTOMY WITH 20 GAUGE MVR PORT Right 04/10/2015  . 25 GAUGE PARS PLANA VITRECTOMY WITH 20 GAUGE MVR PORT Right 04/10/2015   Procedure: 25 GAUGE PARS PLANA VITRECTOMY WITH 20 GAUGE MVR PORT;  Surgeon: Hayden Pedro, MD;  Location: [facility];  Service: Ophthalmology;  Laterality: Right;  . ABDOMINAL HYSTERECTOMY     "still have my left ovary"  . CARPAL TUNNEL RELEASE Bilateral   . COLONOSCOPY WITH PROPOFOL N/A 07/06/2015   Procedure: COLONOSCOPY WITH PROPOFOL;  Surgeon: Lollie Sails, MD;  Location: [facility] ENDOSCOPY;  Service: Endoscopy;  Laterality: N/A;  . ESOPHAGOGASTRODUODENOSCOPY (EGD) WITH PROPOFOL N/A 07/06/2015   Procedure: ESOPHAGOGASTRODUODENOSCOPY (EGD) WITH PROPOFOL;  Surgeon:  Lollie Sails, MD;  Location: [facility] ENDOSCOPY;  Service: Endoscopy;  Laterality: N/A;  . EYE SURGERY    . FRACTURE SURGERY    . GAS/FLUID EXCHANGE Right 04/10/2015   Procedure: GAS/FLUID EXCHANGE;  Surgeon: Hayden Pedro, MD;  Location: [place] [facility];  Service: Ophthalmology;  Laterality: Right;  . LASER PHOTO ABLATION Right 04/10/2015   Procedure: LASER PHOTO ABLATION;  Surgeon: Hayden Pedro, MD;  Location: [place];  Service: Ophthalmology;  Laterality: Right;  Head scope laser  . MEMBRANE PEEL Right 04/10/2015   Procedure: MEMBRANE PEEL;  Surgeon: Hayden Pedro, MD;  Location: [place];  Service: Ophthalmology;  Laterality: Right;  . NASAL SINUS SURGERY    . WRIST FRACTURE SURGERY Right    Social History   Social History  . Marital status: Divorced    Spouse name: N/A  . Number of children: N/A  . Years of education: N/A   Social History Main Topics  . Smoking status: Current Every Day Smoker    Packs/day: 1.00    Years: 40.00    Types: Cigarettes  . Smokeless tobacco: Never Used  . Alcohol use No  . Drug use: No  . Sexual activity: Yes   Other Topics Concern  . Not on file   Social History Narrative  . No narrative on file   No family history on file. Allergies  Allergen Reactions  . Other Other (See Comments)  Allergic to makeup- rash-  Perfume, Hairspray- breathing difficulty and swelling   Prior to Admission medications   Medication Sig Start Date End Date Taking? Authorizing Provider  albuterol (PROVENTIL HFA;VENTOLIN HFA) 108 (90 BASE) MCG/ACT inhaler Inhale 1-2 puffs into the lungs every 6 (six) hours as needed for wheezing or shortness of breath.   Yes [provider]  atorvastatin (LIPITOR) 40 MG tablet Take 40 mg by mouth every morning.   Yes [provider]  cetirizine (ZYRTEC) 10 MG tablet Take 20 mg by mouth daily.    Yes [provider]  cyclobenzaprine (FLEXERIL) 5 MG tablet Take 5 mg by mouth at bedtime.   Yes [provider]  dicyclomine (BENTYL) 10 MG capsule Take 20 mg by mouth 2 (two) times daily before a meal. 03/02/17  Yes [provider]  HYDROcodone-acetaminophen (NORCO) 10-325 MG tablet Take 1 tablet by mouth 2 (two) times daily as needed (pain).  01/19/17  Yes [provider]  Liniments [facility] ARTHRITIS PAIN RELIEF) PADS Apply 1 each topically daily as needed (pain).    Yes [provider]  Naproxen Sodium (ALEVE) 220 MG CAPS Take 440 mg by mouth daily as needed (pain).    Yes [provider]  pantoprazole (PROTONIX) 40 MG tablet Take 40 mg by mouth 2 (two) times daily with a meal. 02/19/17  Yes [provider]  Polyvinyl Alcohol-Povidone (REFRESH OP) Place 1 drop into both eyes every 4 (four) hours as needed (dry eyes).   Yes [provider]  sertraline (ZOLOFT) 100 MG tablet Take 150 mg by mouth daily.    Yes [provider]  traZODone (DESYREL) 100 MG tablet Take 100 mg by mouth at bedtime. 02/12/17  Yes [provider]  triamcinolone cream (KENALOG) 0.1 % Apply 1 application topically at bedtime as needed (rash).    Yes [provider]     All other systems have been reviewed and were otherwise negative with the exception of those mentioned in the HPI and as above.  Physical Exam: There were no vitals filed for this visit.  General: Alert, no acute distress Cardiovascular: No pedal edema Respiratory: No cyanosis, no use of accessory musculature Skin: No lesions in the area of chief complaint Neurologic: Sensation intact distally Psychiatric: Patient is competent for consent with normal mood and affect Lymphatic: No axillary or cervical lymphadenopathy   Assessment/Plan: Bilateral leg pain Plan for Procedure(s): LEFT SIDED LUMBAR 4-5 TRANSFORAMINAL LUMBAR INTERBODY FUSION WITH INSTRUMENTATION AND ALLOGRAFT   Sinclair Ship, MD 03/19/2017 7:03 AM

## 2017-03-19 NOTE — Anesthesia Procedure Notes (Signed)
Procedure Name: Intubation Date/Time: 03/19/2017 10:56 AM Performed by: Shirlyn Goltz Pre-anesthesia Checklist: Patient identified, Emergency Drugs available, Suction available and Patient being monitored Patient Re-evaluated:Patient Re-evaluated prior to induction Oxygen Delivery Method: Circle system utilized Preoxygenation: Pre-oxygenation with 100% oxygen Induction Type: IV induction and Cricoid Pressure applied Ventilation: Mask ventilation without difficulty and Oral airway inserted - appropriate to patient size Laryngoscope Size: Mac and 3 Grade View: Grade I Tube type: Oral Tube size: 7.0 mm Number of attempts: 1 Airway Equipment and Method: Stylet Placement Confirmation: ETT inserted through vocal cords under direct vision,  positive ETCO2 and breath sounds checked- equal and bilateral Secured at: 21 cm Tube secured with: Tape Dental Injury: Teeth and Oropharynx as per pre-operative assessment

## 2017-03-20 MED FILL — Heparin Sodium (Porcine) Inj 1000 Unit/ML: INTRAMUSCULAR | Qty: 30 | Status: AC

## 2017-03-20 MED FILL — Sodium Chloride IV Soln 0.9%: INTRAVENOUS | Qty: 1000 | Status: AC

## 2017-03-20 NOTE — Evaluation (Signed)
Occupational Therapy Evaluation Patient Details Name: Stephanie Powers MRN: [id number] DOB: August 14, 1950 Today's Date: 03/20/2017    History of Present Illness Pt is a 66 y/o female who presents s/p L4-L5 PLIF on 03/19/17. PMH significant for COPD.   Clinical Impression   Pt reports she was independent with ADL PTA. Currently pt requires min assist-min guard assist for functional mobility and mod assist for LB ADL. Pt with difficulty verbally recalling and maintaining back precautions during functional activities. Pt planning to d/c home with supervision from family/friends. Recommending HHOT for follow up to maximize independence and safety with ADL and functional mobility. Pt would benefit from continued skilled OT to address established goals.    Follow Up Recommendations  Home health OT;Supervision/Assistance - 24 hour    Equipment Recommendations  3 in 1 bedside commode    Recommendations for Other Services       Precautions / Restrictions Precautions Precautions: Fall;Back Precaution Booklet Issued: No Precaution Comments: Able to recall 1/3 back precautions. Reviewed in detail Required Braces or Orthoses: Spinal Brace Spinal Brace: Thoracolumbosacral orthotic;Applied in sitting position Restrictions Weight Bearing Restrictions: No      Mobility Bed Mobility        General bed mobility comments: Pt OOB upon arrival  Transfers Overall transfer level: Needs assistance Equipment used: None Transfers: Sit to/from Stand Sit to Stand: Min assist         General transfer comment: for balance without use of AD. Cues for technique    Balance Overall balance assessment: Needs assistance Sitting-balance support: Feet supported;No upper extremity supported Sitting balance-Leahy Scale: Fair     Standing balance support: No upper extremity supported;During functional activity Standing balance-Leahy Scale: Poor Standing balance comment: needs assist or UE support                            ADL either performed or assessed with clinical judgement   ADL Overall ADL's : Needs assistance/impaired Eating/Feeding: Set up;Sitting   Grooming: Set up;Supervision/safety;Sitting   Upper Body Bathing: Set up;Supervision/ safety;Sitting   Lower Body Bathing: Minimal assistance;Sit to/from stand   Upper Body Dressing : Moderate assistance;Sitting Upper Body Dressing Details (indicate cue type and reason): for brace management Lower Body Dressing: Moderate assistance;Sit to/from stand Lower Body Dressing Details (indicate cue type and reason): Assist for starting underwear over feet and for shoes Toilet Transfer: Min guard;Ambulation;BSC;RW Toilet Transfer Details (indicate cue type and reason): Simulated by sit to stand from chair with functional mobility       Tub/Shower Transfer Details (indicate cue type and reason): Recommended not getting in tub for bathing initially- sponge bathing for safety. Pt agreeable Functional mobility during ADLs: Min guard;Rolling walker General ADL Comments: Pt very unsteady in standing without use of RW; better with UE support     Vision         Perception     Praxis      Pertinent Vitals/Pain Pain Assessment: Faces Faces Pain Scale: Hurts even more Pain Location: back Pain Descriptors / Indicators: Aching;Grimacing;Guarding Pain Intervention(s): Monitored during session;Limited activity within patient's tolerance     Hand Dominance     Extremity/Trunk Assessment Upper Extremity Assessment Upper Extremity Assessment: Overall WFL for tasks assessed   Lower Extremity Assessment Lower Extremity Assessment: Defer to PT evaluation   Cervical / Trunk Assessment Cervical / Trunk Assessment: Other exceptions Cervical / Trunk Exceptions: Forward head/rounded shoulder posture   Communication Communication Communication: No difficulties  Cognition Arousal/Alertness: Lethargic Behavior During Therapy:  WFL for tasks assessed/performed Overall Cognitive Status: No family/caregiver present to determine baseline cognitive functioning                                 General Comments: Uncertain of baseline, however pt with decreased safety awareness, slow processing, and difficulty problem solving at times   General Comments       Exercises     Shoulder Instructions      Home Living Family/patient expects to be discharged to:: Private residence Living Arrangements: Spouse/significant other Available Help at Discharge: Family;Available 24 hours/day;Neighbor Type of Home: House Home Access: Stairs to enter [employer] of Steps: 5   Home Layout: One level     Bathroom Shower/Tub: Teacher, early years/pre: Standard Bathroom Accessibility: Yes   Home Equipment: None          Prior Functioning/Environment Level of Independence: Independent                 OT Problem List: Decreased strength;Decreased activity tolerance;Impaired balance (sitting and/or standing);Decreased cognition;Decreased safety awareness;Decreased knowledge of use of DME or AE;Decreased knowledge of precautions;Pain      OT Treatment/Interventions: Self-care/ADL training;Therapeutic exercise;DME and/or AE instruction;Balance training;Patient/family education;Therapeutic activities    OT Goals(Current goals can be found in the care plan section) Acute Rehab OT Goals Patient Stated Goal: go home OT Goal Formulation: With patient Time For Goal Achievement: 04/03/17 Potential to Achieve Goals: Good ADL Goals Pt Will Transfer to Toilet: ambulating;bedside commode;with supervision Pt Will Perform Toileting - Clothing Manipulation and hygiene: with supervision;sit to/from stand Pt Will Perform Tub/Shower Transfer: Tub transfer;with supervision;ambulating;3 in 1;rolling walker Additional ADL Goal #1: Pt will don/doff back brace with set up as precursor to ADL. Additional  ADL Goal #2: Pt will verbally recall 3/3 back precautions and maintain thorughout ADL.  OT Frequency: Min 2X/week   Barriers to D/C:            Co-evaluation              AM-PAC PT "6 Clicks" Daily Activity     Outcome Measure Help from another person eating meals?: None Help from another person taking care of personal grooming?: A Little Help from another person toileting, which includes using toliet, bedpan, or urinal?: A Little Help from another person bathing (including washing, rinsing, drying)?: A Little Help from another person to put on and taking off regular upper body clothing?: A Lot Help from another person to put on and taking off regular lower body clothing?: A Lot 6 Click Score: 17   End of Session Equipment Utilized During Treatment: Rolling walker;Back brace Nurse Communication: Mobility status;Other (comment) (f/u and equipment recommendations)  Activity Tolerance: Patient tolerated treatment well Patient left: Other (comment) (standing in room by chair)  OT Visit Diagnosis: Unsteadiness on feet (R26.81);Other abnormalities of gait and mobility (R26.89);Pain Pain - part of body:  (back)                Time: 1005-1020 OT Time Calculation (min): 15 min Charges:  OT General Charges $OT Visit: 1 Procedure OT Evaluation $OT Eval Moderate Complexity: 1 Procedure G-Codes:     Matias Thurman A. Ulice Brilliant, M.S., OTR/L Pager: [place] 03/20/2017, 10:29 AM

## 2017-03-20 NOTE — Evaluation (Signed)
Physical Therapy Evaluation Patient Details Name: Stephanie Powers MRN: [id number] DOB: June 24, 1951 Today's Date: 03/20/2017   History of Present Illness  Pt is a 66 y/o female who presents s/p L4-L5 PLIF on 03/19/17. PMH significant for COPD.  Clinical Impression  Pt admitted with above diagnosis. Pt currently with functional limitations due to the deficits listed below (see PT Problem List). At the time of PT eval pt was able to perform transfers and ambulation with min guard to min assist for balance support and safety. Pt with increased difficulty donning brace and recalling back precautions. She was cued for precautions throughout functional mobility. Pt reports she will have 24 hour assistance at home "mostly" but is planning to rely on a neighbor when her husband is not home. Feel this patient would benefit from the [place] for increased assistance initially and to reinforce safe mobility with maintenance of precautions and proper brace application. Pt will benefit from skilled PT to increase their independence and safety with mobility to allow discharge to the venue listed below.       Follow Up Recommendations Home health PT;Supervision/Assistance - 24 hour ([facility] if possible)    Equipment Recommendations  Rolling walker with 5" wheels;3in1 (PT)    Recommendations for Other Services       Precautions / Restrictions Precautions Precautions: Fall;Back Precaution Booklet Issued: Yes (comment) Precaution Comments: Will require reinforcement of precautions and brace application. Pt unable to recall any precautions at end of session.  Required Braces or Orthoses: Spinal Brace Spinal Brace: Thoracolumbosacral orthotic;Applied in sitting position Restrictions Weight Bearing Restrictions: No      Mobility  Bed Mobility Overal bed mobility: Needs Assistance Bed Mobility: Rolling;Sidelying to Sit Rolling: Supervision Sidelying to sit: Min assist        General bed mobility comments: VC's for proper log roll technique. Pt required increased time and assist for trunk elevation to full sitting position.   Transfers Overall transfer level: Needs assistance Equipment used: 1 person hand held assist Transfers: Sit to/from Stand Sit to Stand: Min assist         General transfer comment: Assist to power-up to full standing position. Pt unsteady and required HHA  Ambulation/Gait Ambulation/Gait assistance: Min assist Ambulation Distance (Feet): 200 Feet Assistive device: Rolling walker (2 wheeled);1 person hand held assist Gait Pattern/deviations: Step-through pattern;Decreased stride length;Trunk flexed;Staggering left;Staggering right Gait velocity: Decreased Gait velocity interpretation: Below normal speed for age/gender General Gait Details: Grossly antalgic gait with assist required for balance support and safety. Pt given RW and had difficulty advancing LE's (moving extremely slow). Increased time and step-by-step cues required for improved gait speed.   Stairs            Wheelchair Mobility    Modified Rankin (Stroke Patients Only)       Balance Overall balance assessment: Needs assistance Sitting-balance support: Feet supported;No upper extremity supported Sitting balance-Leahy Scale: Fair     Standing balance support: No upper extremity supported;During functional activity Standing balance-Leahy Scale: Poor                               Pertinent Vitals/Pain Pain Assessment: Faces Faces Pain Scale: Hurts little more Pain Location: Incision site Pain Descriptors / Indicators: Operative site guarding;Discomfort Pain Intervention(s): Limited activity within patient's tolerance;Monitored during session;Repositioned    Home Living Family/patient expects to be discharged to:: Private residence Living Arrangements: Spouse/significant other Available Help at  Discharge: Family;Available 24  hours/day;Neighbor Type of Home: House Home Access: Stairs to enter   [employer] of Steps: 5 Home Layout: One level Home Equipment: None      Prior Function Level of Independence: Independent               Hand Dominance        Extremity/Trunk Assessment   Upper Extremity Assessment Upper Extremity Assessment: Defer to OT evaluation    Lower Extremity Assessment Lower Extremity Assessment: Generalized weakness (consistent with pre-op diagnosis)    Cervical / Trunk Assessment Cervical / Trunk Assessment: Other exceptions Cervical / Trunk Exceptions: Forward head/rounded shoulder posture  Communication   Communication: No difficulties  Cognition Arousal/Alertness: Lethargic Behavior During Therapy: WFL for tasks assessed/performed Overall Cognitive Status: No family/caregiver present to determine baseline cognitive functioning                                 General Comments: Uncertain of baseline, however pt with decreased safety awareness, slow processing, and difficulty problem solving at times      General Comments      Exercises     Assessment/Plan    PT Assessment Patient needs continued PT services  PT Problem List Decreased strength;Decreased range of motion;Decreased activity tolerance;Decreased balance;Decreased mobility;Decreased knowledge of use of DME;Decreased safety awareness;Decreased knowledge of precautions;Pain       PT Treatment Interventions DME instruction;Gait training;Stair training;Functional mobility training;Therapeutic activities;Therapeutic exercise;Neuromuscular re-education;Patient/family education    PT Goals (Current goals can be found in the Care Plan section)  Acute Rehab PT Goals Patient Stated Goal: Pt did not state goals PT Goal Formulation: With patient Time For Goal Achievement: 03/27/17 Potential to Achieve Goals: Good    Frequency Min 5X/week   Barriers to discharge         Co-evaluation               AM-PAC PT "6 Clicks" Daily Activity  Outcome Measure Difficulty turning over in bed (including adjusting bedclothes, sheets and blankets)?: None Difficulty moving from lying on back to sitting on the side of the bed? : A Little Difficulty sitting down on and standing up from a chair with arms (e.g., wheelchair, bedside commode, etc,.)?: A Little Help needed moving to and from a bed to chair (including a wheelchair)?: A Little Help needed walking in hospital room?: A Little Help needed climbing 3-5 steps with a railing? : A Lot 6 Click Score: 18    End of Session Equipment Utilized During Treatment: Gait belt;Back brace Activity Tolerance: Patient limited by fatigue;Patient limited by pain Patient left: in chair;with call bell/phone within reach Nurse Communication: Mobility status PT Visit Diagnosis: Unsteadiness on feet (R26.81);Other symptoms and signs involving the nervous system (R29.898);Pain Pain - part of body:  (back)    Time: 0802-0830 PT Time Calculation (min) (ACUTE ONLY): 28 min   Charges:   PT Evaluation $PT Eval Moderate Complexity: 1 Mod PT Treatments $Gait Training: 8-22 mins   PT G Codes:        Rolinda Roan, PT, DPT Acute Rehabilitation Services Pager: [phone]   Thelma Comp 03/20/2017, 9:50 AM

## 2017-03-20 NOTE — Progress Notes (Signed)
    Patient doing well Patient denies leg pain or back pain   Physical Exam: Vitals:   03/19/17 2330 03/20/17 0400  BP: (!) 112/49 (!) 108/57  Pulse: 85 90  Resp: 16 18  Temp: 98.7 F (37.1 C) 98.3 F (36.8 C)  SpO2: 100% 94%    Dressing in place NVI  POD #1 s/p L4/5 decompression and fusion, doing well  - up with PT/OT, encourage ambulation - Percocet for pain, Valium for muscle spasms - likely d/c home later today after PT and OT,with follow-up in 2 weeks

## 2017-03-20 NOTE — Care Management Note (Signed)
Case Management Note  Patient Details  Name: Stephanie Powers MRN: [id number] Date of Birth: 1951/07/25  Subjective/Objective:  66 yr old female s/p L4-L5 PLIF.                  Action/Plan: Case manager spoke with patient concerning discharge plan and DME needs. Choice for Home Health agency was offered. Referral was called to [place]ny [place], Liaison with [facility]. Patient says she will have support from significant other and a neighbor at discharge.     Expected Discharge Date:  03/20/17               Expected Discharge Plan:  [place]  In-House Referral:  NA  Discharge planning Services  CM Consult  Post Acute Care Choice:  Home Health, Durable Medical Equipment Choice offered to:  Patient  DME Arranged:  3-N-1, Walker rolling DME Agency:  [place]:  PT [place]:  [place]  Status of Service:  Completed, signed off  If discussed at [place] of Stay Meetings, dates discussed:    Additional Comments:  Ninfa Meeker, RN 03/20/2017, 11:09 AM

## 2017-03-20 NOTE — Progress Notes (Signed)
Patient alert and oriented, mae's well, voiding adequate amount of urine, swallowing without difficulty, no c/o pain at time of discharge. Patient discharged home with family. Script and discharged instructions given to patient. Patient and family stated understanding of instructions given. Patient has an appointment with Dr. Dumonski 

## 2017-03-23 DIAGNOSIS — F329 Major depressive disorder, single episode, unspecified: Secondary | ICD-10-CM | POA: Diagnosis not present

## 2017-03-23 DIAGNOSIS — Z7951 Long term (current) use of inhaled steroids: Secondary | ICD-10-CM | POA: Diagnosis not present

## 2017-03-23 DIAGNOSIS — J449 Chronic obstructive pulmonary disease, unspecified: Secondary | ICD-10-CM | POA: Diagnosis not present

## 2017-03-23 DIAGNOSIS — Z9181 History of falling: Secondary | ICD-10-CM | POA: Diagnosis not present

## 2017-03-23 DIAGNOSIS — E7800 Pure hypercholesterolemia, unspecified: Secondary | ICD-10-CM | POA: Diagnosis not present

## 2017-03-23 DIAGNOSIS — M1990 Unspecified osteoarthritis, unspecified site: Secondary | ICD-10-CM | POA: Diagnosis not present

## 2017-03-23 DIAGNOSIS — M48061 Spinal stenosis, lumbar region without neurogenic claudication: Secondary | ICD-10-CM | POA: Diagnosis not present

## 2017-03-23 DIAGNOSIS — Z981 Arthrodesis status: Secondary | ICD-10-CM | POA: Diagnosis not present

## 2017-03-23 DIAGNOSIS — Z4789 Encounter for other orthopedic aftercare: Secondary | ICD-10-CM | POA: Diagnosis not present

## 2017-03-23 DIAGNOSIS — F17210 Nicotine dependence, cigarettes, uncomplicated: Secondary | ICD-10-CM | POA: Diagnosis not present

## 2017-03-23 DIAGNOSIS — K219 Gastro-esophageal reflux disease without esophagitis: Secondary | ICD-10-CM | POA: Diagnosis not present

## 2017-03-23 DIAGNOSIS — M5416 Radiculopathy, lumbar region: Secondary | ICD-10-CM | POA: Diagnosis not present

## 2017-03-25 NOTE — Discharge Summary (Signed)
Patient ID: Stephanie Powers MRN: [id number] DOB/AGE: [birth year] 66 y.o.  Admit date: 03/19/2017 Discharge date: 03/20/2017  Admission Diagnoses:  Active Problems:   Radiculopathy   Discharge Diagnoses:  Same  Past Medical History:  Diagnosis Date  . Arthritis    "hands, shoulders, feet, back" (04/10/2015)  . Chronic lower back pain   . COPD (chronic obstructive pulmonary disease) ([place])   . Depression   . Dysrhythmia    "nothing to worry about" per MD  . Family history of adverse reaction to anesthesia    Mother had stroke as she came out of anesthesia for back surgery.  (Blood pressure was high  . Frequent sinus infections   . GERD (gastroesophageal reflux disease)   . Heart murmur   . Hypercholesterolemia   . Migraine    "once/month now" (04/10/2015)  . Rash    "anywhere it wants to" chronic  . Rash of neck    chronic  . Shortness of breath dyspnea    with exertion  . Spinal stenosis of lumbar region     Surgeries: Procedure(s): LEFT SIDED LUMBAR 4-5 TRANSFORAMINAL LUMBAR INTERBODY FUSION WITH INSTRUMENTATION AND ALLOGRAFT on 03/19/2017   Consultants: None  Discharged Condition: Improved  Hospital Course: Stephanie Powers is an 66 y.o. female who was admitted 03/19/2017 for operative treatment of radiculopathy. Patient has severe unremitting pain that affects sleep, daily activities, and work/hobbies. After pre-op clearance the patient was taken to the operating room on 03/19/2017 and underwent  Procedure(s): LEFT SIDED LUMBAR 4-5 TRANSFORAMINAL LUMBAR INTERBODY FUSION WITH INSTRUMENTATION AND ALLOGRAFT.    Patient was given perioperative antibiotics:  Anti-infectives    Start     Dose/Rate Route Frequency Ordered Stop   03/19/17 2000  ceFAZolin (ANCEF) IVPB 2g/100 mL premix     2 g 200 mL/hr over 30 Minutes Intravenous Every 8 hours 03/19/17 1802 03/20/17 0534   03/19/17 0945  ceFAZolin (ANCEF) IVPB 2g/100 mL premix     2 g 200 mL/hr over 30 Minutes  Intravenous On call to O.R. 03/18/17 1507 03/19/17 1110       Patient was given sequential compression devices, early ambulation to prevent DVT.  Patient benefited maximally from hospital stay and there were no complications.    Recent vital signs: BP (!) 108/57   Pulse 90   Temp 98.3 F (36.8 C) (Oral)   Resp 18   Ht 5\' 1"  (1.549 m)   Wt 49.9 kg (110 lb)   SpO2 94%   BMI 20.78 kg/m    Discharge Medications:   Allergies as of 03/20/2017      Reactions   Other Other (See Comments)   Allergic to makeup- rash-  Perfume, Hairspray- breathing difficulty and swelling      Medication List    TAKE these medications   albuterol 108 (90 Base) MCG/ACT inhaler Commonly known as:  PROVENTIL HFA;VENTOLIN HFA Inhale 1-2 puffs into the lungs every 6 (six) hours as needed for wheezing or shortness of breath.   atorvastatin 40 MG tablet Commonly known as:  LIPITOR Take 40 mg by mouth every morning.   cetirizine 10 MG tablet Commonly known as:  ZYRTEC Take 20 mg by mouth daily.   dicyclomine 10 MG capsule Commonly known as:  BENTYL Take 20 mg by mouth 2 (two) times daily before a meal.   pantoprazole 40 MG tablet Commonly known as:  PROTONIX Take 40 mg by mouth 2 (two) times daily with a meal.   REFRESH  OP Place 1 drop into both eyes every 4 (four) hours as needed (dry eyes).   SALONPAS ARTHRITIS PAIN RELIEF Pads Apply 1 each topically daily as needed (pain).   sertraline 100 MG tablet Commonly known as:  ZOLOFT Take 150 mg by mouth daily.   traZODone 100 MG tablet Commonly known as:  DESYREL Take 100 mg by mouth at bedtime.   triamcinolone cream 0.1 % Commonly known as:  KENALOG Apply 1 application topically at bedtime as needed (rash).       Diagnostic Studies: Dg Chest 2 View  Result Date: 03/16/2017 CLINICAL DATA:  Preoperative films. EXAM: CHEST  2 VIEW COMPARISON:  September 11, 2010 FINDINGS: The heart size and mediastinal contours are within normal limits.  Both lungs are clear. The visualized skeletal structures are unremarkable. IMPRESSION: No active cardiopulmonary disease. Electronically Signed   By: Dorise Bullion III M.D   On: 03/16/2017 16:54   Dg Lumbar Spine 2-3 Views  Result Date: 03/19/2017 CLINICAL DATA:  Lumbar fusion. EXAM: LUMBAR SPINE - 2-3 VIEW; DG C-ARM 61-120 MIN COMPARISON:  03/19/2017. FINDINGS: Lumbar spine numbered as per prior exam. L4-L5 posterior and interbody fusion. Hardware intact . Stable mild anterolisthesis L4-L5. IMPRESSION: L4-L5 posterior and interbody fusion. Electronically Signed   By: Marcello Moores  Register   On: 03/19/2017 15:50   Dg Lumbar Spine 1 View  Result Date: 03/19/2017 CLINICAL DATA:  Intraoperative localization film for patient undergoing L4-5 fusion. EXAM: LUMBAR SPINE - 1 VIEW COMPARISON:  MRI lumbar spine 02/09/2017. FINDINGS: Single view of the lumbar spine lateral projection is provided. 2 probes are identified. The more superior probe is at the level of the L4 pedicles. The more inferior probe is at the level of the inferior endplate of L5. IMPRESSION: Localization as above. Electronically Signed   By: Inge Rise M.D.   On: 03/19/2017 12:46   Dg C-arm 61-120 Min  Result Date: 03/19/2017 CLINICAL DATA:  Lumbar fusion. EXAM: LUMBAR SPINE - 2-3 VIEW; DG C-ARM 61-120 MIN COMPARISON:  03/19/2017. FINDINGS: Lumbar spine numbered as per prior exam. L4-L5 posterior and interbody fusion. Hardware intact . Stable mild anterolisthesis L4-L5. IMPRESSION: L4-L5 posterior and interbody fusion. Electronically Signed   By: Marcello Moores  Register   On: 03/19/2017 15:50    Disposition: 01-Home or Self Care   POD #1 s/p L4/5 decompression and fusion, doing well  -Written scripts for pain signed and in chart -D/C instructions sheet printed and in chart -D/C today  -F/U in office 2 weeks   Signed: Justice Britain 03/25/2017, 11:02 AM

## 2017-04-01 DIAGNOSIS — Z981 Arthrodesis status: Secondary | ICD-10-CM | POA: Diagnosis not present

## 2017-04-01 DIAGNOSIS — Z7951 Long term (current) use of inhaled steroids: Secondary | ICD-10-CM | POA: Diagnosis not present

## 2017-04-01 DIAGNOSIS — Z9181 History of falling: Secondary | ICD-10-CM | POA: Diagnosis not present

## 2017-04-01 DIAGNOSIS — J449 Chronic obstructive pulmonary disease, unspecified: Secondary | ICD-10-CM | POA: Diagnosis not present

## 2017-04-01 DIAGNOSIS — K219 Gastro-esophageal reflux disease without esophagitis: Secondary | ICD-10-CM | POA: Diagnosis not present

## 2017-04-01 DIAGNOSIS — M5416 Radiculopathy, lumbar region: Secondary | ICD-10-CM | POA: Diagnosis not present

## 2017-04-01 DIAGNOSIS — M48061 Spinal stenosis, lumbar region without neurogenic claudication: Secondary | ICD-10-CM | POA: Diagnosis not present

## 2017-04-01 DIAGNOSIS — F329 Major depressive disorder, single episode, unspecified: Secondary | ICD-10-CM | POA: Diagnosis not present

## 2017-04-01 DIAGNOSIS — F17210 Nicotine dependence, cigarettes, uncomplicated: Secondary | ICD-10-CM | POA: Diagnosis not present

## 2017-04-01 DIAGNOSIS — M1990 Unspecified osteoarthritis, unspecified site: Secondary | ICD-10-CM | POA: Diagnosis not present

## 2017-04-01 DIAGNOSIS — E7800 Pure hypercholesterolemia, unspecified: Secondary | ICD-10-CM | POA: Diagnosis not present

## 2017-04-01 DIAGNOSIS — Z4789 Encounter for other orthopedic aftercare: Secondary | ICD-10-CM | POA: Diagnosis not present

## 2017-04-03 DIAGNOSIS — M4316 Spondylolisthesis, lumbar region: Secondary | ICD-10-CM | POA: Diagnosis not present

## 2017-04-03 DIAGNOSIS — Z9889 Other specified postprocedural states: Secondary | ICD-10-CM | POA: Diagnosis not present

## 2017-04-07 DIAGNOSIS — E7800 Pure hypercholesterolemia, unspecified: Secondary | ICD-10-CM | POA: Diagnosis not present

## 2017-04-07 DIAGNOSIS — F329 Major depressive disorder, single episode, unspecified: Secondary | ICD-10-CM | POA: Diagnosis not present

## 2017-04-07 DIAGNOSIS — Z0000 Encounter for general adult medical examination without abnormal findings: Secondary | ICD-10-CM | POA: Diagnosis not present

## 2017-04-07 DIAGNOSIS — Z136 Encounter for screening for cardiovascular disorders: Secondary | ICD-10-CM | POA: Diagnosis not present

## 2017-04-07 DIAGNOSIS — M48061 Spinal stenosis, lumbar region without neurogenic claudication: Secondary | ICD-10-CM | POA: Diagnosis not present

## 2017-04-07 DIAGNOSIS — K2270 Barrett's esophagus without dysplasia: Secondary | ICD-10-CM | POA: Diagnosis not present

## 2017-04-07 DIAGNOSIS — J449 Chronic obstructive pulmonary disease, unspecified: Secondary | ICD-10-CM | POA: Diagnosis not present

## 2017-04-07 DIAGNOSIS — Z9181 History of falling: Secondary | ICD-10-CM | POA: Diagnosis not present

## 2017-04-07 DIAGNOSIS — Z79899 Other long term (current) drug therapy: Secondary | ICD-10-CM | POA: Diagnosis not present

## 2017-04-07 DIAGNOSIS — Z981 Arthrodesis status: Secondary | ICD-10-CM | POA: Diagnosis not present

## 2017-04-07 DIAGNOSIS — E785 Hyperlipidemia, unspecified: Secondary | ICD-10-CM | POA: Diagnosis not present

## 2017-04-07 DIAGNOSIS — M4316 Spondylolisthesis, lumbar region: Secondary | ICD-10-CM | POA: Diagnosis not present

## 2017-04-07 DIAGNOSIS — Z720 Tobacco use: Secondary | ICD-10-CM | POA: Diagnosis not present

## 2017-04-07 DIAGNOSIS — R634 Abnormal weight loss: Secondary | ICD-10-CM | POA: Diagnosis not present

## 2017-05-01 DIAGNOSIS — M4316 Spondylolisthesis, lumbar region: Secondary | ICD-10-CM | POA: Diagnosis not present

## 2017-06-12 DIAGNOSIS — M4316 Spondylolisthesis, lumbar region: Secondary | ICD-10-CM | POA: Diagnosis not present

## 2017-06-12 DIAGNOSIS — Z9889 Other specified postprocedural states: Secondary | ICD-10-CM | POA: Diagnosis not present

## 2017-06-23 DIAGNOSIS — Z9889 Other specified postprocedural states: Secondary | ICD-10-CM | POA: Diagnosis not present

## 2017-06-23 DIAGNOSIS — M4316 Spondylolisthesis, lumbar region: Secondary | ICD-10-CM | POA: Diagnosis not present

## 2017-06-23 DIAGNOSIS — M545 Low back pain: Secondary | ICD-10-CM | POA: Diagnosis not present

## 2017-06-23 DIAGNOSIS — M4326 Fusion of spine, lumbar region: Secondary | ICD-10-CM | POA: Diagnosis not present

## 2017-06-24 DIAGNOSIS — M4316 Spondylolisthesis, lumbar region: Secondary | ICD-10-CM | POA: Diagnosis not present

## 2017-06-24 DIAGNOSIS — M545 Low back pain: Secondary | ICD-10-CM | POA: Diagnosis not present

## 2017-06-24 DIAGNOSIS — M4326 Fusion of spine, lumbar region: Secondary | ICD-10-CM | POA: Diagnosis not present

## 2017-06-24 DIAGNOSIS — Z9889 Other specified postprocedural states: Secondary | ICD-10-CM | POA: Diagnosis not present

## 2017-07-01 DIAGNOSIS — M4316 Spondylolisthesis, lumbar region: Secondary | ICD-10-CM | POA: Diagnosis not present

## 2017-07-01 DIAGNOSIS — M4326 Fusion of spine, lumbar region: Secondary | ICD-10-CM | POA: Diagnosis not present

## 2017-07-01 DIAGNOSIS — M545 Low back pain: Secondary | ICD-10-CM | POA: Diagnosis not present

## 2017-07-01 DIAGNOSIS — Z9889 Other specified postprocedural states: Secondary | ICD-10-CM | POA: Diagnosis not present

## 2017-07-03 DIAGNOSIS — M4316 Spondylolisthesis, lumbar region: Secondary | ICD-10-CM | POA: Diagnosis not present

## 2017-07-03 DIAGNOSIS — M4326 Fusion of spine, lumbar region: Secondary | ICD-10-CM | POA: Diagnosis not present

## 2017-07-03 DIAGNOSIS — Z9889 Other specified postprocedural states: Secondary | ICD-10-CM | POA: Diagnosis not present

## 2017-07-03 DIAGNOSIS — M545 Low back pain: Secondary | ICD-10-CM | POA: Diagnosis not present

## 2017-07-06 ENCOUNTER — Emergency Department (HOSPITAL_COMMUNITY)
Admission: EM | Admit: 2017-07-06 | Discharge: 2017-07-06 | Disposition: A | Discharge status: Home / Self Care | Payer: PPO | Attending: Emergency Medicine | Admitting: Emergency Medicine

## 2017-07-06 ENCOUNTER — Encounter (HOSPITAL_COMMUNITY): Payer: Self-pay | Admitting: Emergency Medicine

## 2017-07-06 ENCOUNTER — Emergency Department (HOSPITAL_COMMUNITY): Payer: PPO

## 2017-07-06 ENCOUNTER — Other Ambulatory Visit: Payer: Self-pay

## 2017-07-06 DIAGNOSIS — S52512A Displaced fracture of left radial styloid process, initial encounter for closed fracture: Secondary | ICD-10-CM | POA: Diagnosis not present

## 2017-07-06 DIAGNOSIS — S52502A Unspecified fracture of the lower end of left radius, initial encounter for closed fracture: Secondary | ICD-10-CM | POA: Insufficient documentation

## 2017-07-06 DIAGNOSIS — F17210 Nicotine dependence, cigarettes, uncomplicated: Secondary | ICD-10-CM | POA: Diagnosis not present

## 2017-07-06 DIAGNOSIS — S52611A Displaced fracture of right ulna styloid process, initial encounter for closed fracture: Secondary | ICD-10-CM | POA: Diagnosis not present

## 2017-07-06 DIAGNOSIS — Y999 Unspecified external cause status: Secondary | ICD-10-CM | POA: Insufficient documentation

## 2017-07-06 DIAGNOSIS — Y92009 Unspecified place in unspecified non-institutional (private) residence as the place of occurrence of the external cause: Secondary | ICD-10-CM | POA: Insufficient documentation

## 2017-07-06 DIAGNOSIS — S52615A Nondisplaced fracture of left ulna styloid process, initial encounter for closed fracture: Secondary | ICD-10-CM | POA: Diagnosis not present

## 2017-07-06 DIAGNOSIS — W19XXXA Unspecified fall, initial encounter: Secondary | ICD-10-CM | POA: Insufficient documentation

## 2017-07-06 DIAGNOSIS — Y9389 Activity, other specified: Secondary | ICD-10-CM | POA: Diagnosis not present

## 2017-07-06 DIAGNOSIS — M545 Low back pain: Secondary | ICD-10-CM | POA: Diagnosis not present

## 2017-07-06 DIAGNOSIS — Z79899 Other long term (current) drug therapy: Secondary | ICD-10-CM | POA: Insufficient documentation

## 2017-07-06 DIAGNOSIS — S6992XA Unspecified injury of left wrist, hand and finger(s), initial encounter: Secondary | ICD-10-CM | POA: Diagnosis present

## 2017-07-06 DIAGNOSIS — J449 Chronic obstructive pulmonary disease, unspecified: Secondary | ICD-10-CM | POA: Insufficient documentation

## 2017-07-06 DIAGNOSIS — S52612A Displaced fracture of left ulna styloid process, initial encounter for closed fracture: Secondary | ICD-10-CM | POA: Diagnosis not present

## 2017-07-06 MED ORDER — HYDROCODONE-ACETAMINOPHEN 5-325 MG PO TABS: 1.0000 | ORAL_TABLET | ORAL | 0 refills | Status: DC | PRN

## 2017-07-06 NOTE — ED Provider Notes (Signed)
[facility] EMERGENCY DEPARTMENT Provider Note   CSN: [id number] Arrival date & time: 07/06/17  2049     History   Chief Complaint Chief Complaint  Patient presents with  . Fall  . Wrist Pain    HPI Stephanie Powers is a 66 y.o. female who presents with left wrist pain. PMH significant for osteoporosis, arthritis, COPD. She states that her cat knocked their santa tree topper off the tree and when she went to go reach upwards to put it back she lost her balance and fell on to her left side with an outstretched hand. She had an immediate onset of pain in the left wrist. She did hit her head and back but denies headache, LOC, dizziness, back pain. She has been able to ambulate without difficulty. She states she did have a lumbar fusion 4 months ago and has overall been doing well and working with physical therapy. She is not on blood thinners. She took 2 Norco prior to arrival that her husband gave her.  HPI  Past Medical History:  Diagnosis Date  . Arthritis    "hands, shoulders, feet, back" (04/10/2015)  . Chronic lower back pain   . COPD (chronic obstructive pulmonary disease) ([place])   . Depression   . Dysrhythmia    "nothing to worry about" per MD  . Family history of adverse reaction to anesthesia    Mother had stroke as she came out of anesthesia for back surgery.  (Blood pressure was high  . Frequent sinus infections   . GERD (gastroesophageal reflux disease)   . Heart murmur   . Hypercholesterolemia   . Migraine    "once/month now" (04/10/2015)  . Rash    "anywhere it wants to" chronic  . Rash of neck    chronic  . Shortness of breath dyspnea    with exertion  . Spinal stenosis of lumbar region     Patient Active Problem List   Diagnosis Date Noted  . Radiculopathy 03/19/2017  . Preretinal fibrosis 04/10/2015  . Preretinal fibrosis, right eye 04/06/2015    Past Surgical History:  Procedure Laterality Date  . [place] VITRECTOMY  WITH 20 GAUGE MVR PORT Right 04/10/2015  . 25 GAUGE PARS PLANA VITRECTOMY WITH 20 GAUGE MVR PORT Right 04/10/2015   Procedure: 25 GAUGE PARS PLANA VITRECTOMY WITH 20 GAUGE MVR PORT;  Surgeon: Hayden Pedro, MD;  Location: [facility];  Service: Ophthalmology;  Laterality: Right;  . ABDOMINAL HYSTERECTOMY     "still have my left ovary"  . CARPAL TUNNEL RELEASE Bilateral   . COLONOSCOPY WITH PROPOFOL N/A 07/06/2015   Procedure: COLONOSCOPY WITH PROPOFOL;  Surgeon: Lollie Sails, MD;  Location: [facility] ENDOSCOPY;  Service: Endoscopy;  Laterality: N/A;  . ESOPHAGOGASTRODUODENOSCOPY (EGD) WITH PROPOFOL N/A 07/06/2015   Procedure: ESOPHAGOGASTRODUODENOSCOPY (EGD) WITH PROPOFOL;  Surgeon: Lollie Sails, MD;  Location: [facility] ENDOSCOPY;  Service: Endoscopy;  Laterality: N/A;  . ESOPHAGOGASTRODUODENOSCOPY (EGD) WITH PROPOFOL N/A 03/17/2017   Procedure: ESOPHAGOGASTRODUODENOSCOPY (EGD) WITH PROPOFOL;  Surgeon: Lollie Sails, MD;  Location: [facility] ENDOSCOPY;  Service: Endoscopy;  Laterality: N/A;  . EYE SURGERY    . FRACTURE SURGERY    . GAS/FLUID EXCHANGE Right 04/10/2015   Procedure: GAS/FLUID EXCHANGE;  Surgeon: Hayden Pedro, MD;  Location: [facility];  Service: Ophthalmology;  Laterality: Right;  . LASER PHOTO ABLATION Right 04/10/2015   Procedure: LASER PHOTO ABLATION;  Surgeon: Hayden Pedro, MD;  Location: [facility];  Service:  Ophthalmology;  Laterality: Right;  Head scope laser  . MEMBRANE PEEL Right 04/10/2015   Procedure: MEMBRANE PEEL;  Surgeon: Hayden Pedro, MD;  Location: [facility];  Service: Ophthalmology;  Laterality: Right;  . NASAL SINUS SURGERY    . WRIST FRACTURE SURGERY Right     OB History    No data available       Home Medications    Prior to Admission medications   Medication Sig Start Date End Date Taking? Authorizing Provider  albuterol (PROVENTIL HFA;VENTOLIN HFA) 108 (90 BASE) MCG/ACT inhaler Inhale 1-2 puffs into the lungs every 6 (six) hours as needed for wheezing or  shortness of breath.    [provider]  atorvastatin (LIPITOR) 40 MG tablet Take 40 mg by mouth every morning.    [provider]  cetirizine (ZYRTEC) 10 MG tablet Take 20 mg by mouth daily.     [provider]  dicyclomine (BENTYL) 10 MG capsule Take 20 mg by mouth 2 (two) times daily before a meal. 03/02/17   [provider]  Liniments [facility] ARTHRITIS PAIN RELIEF) PADS Apply 1 each topically daily as needed (pain).     [provider]  pantoprazole (PROTONIX) 40 MG tablet Take 40 mg by mouth 2 (two) times daily with a meal. 02/19/17   [provider]  Polyvinyl Alcohol-Povidone (REFRESH OP) Place 1 drop into both eyes every 4 (four) hours as needed (dry eyes).    [provider]  sertraline (ZOLOFT) 100 MG tablet Take 150 mg by mouth daily.     [provider]  traZODone (DESYREL) 100 MG tablet Take 100 mg by mouth at bedtime. 02/12/17   [provider]  triamcinolone cream (KENALOG) 0.1 % Apply 1 application topically at bedtime as needed (rash).     [provider]    Family History No family history on file.  Social History Social History   Tobacco Use  . Smoking status: Current Every Day Smoker    Packs/day: 1.00    Years: 40.00    Pack years: 40.00    Types: Cigarettes  . Smokeless tobacco: Never Used  Substance Use Topics  . Alcohol use: No  . Drug use: No     Allergies   Other   Review of Systems Review of Systems  Respiratory: Negative for shortness of breath.   Cardiovascular: Negative for chest pain.  Musculoskeletal: Positive for arthralgias and joint swelling. Negative for back pain, gait problem, myalgias and neck pain.  Skin: Negative for wound.  Neurological: Negative for dizziness, weakness and headaches.  All other systems reviewed and are negative.    Physical Exam Updated Vital Signs BP (!) 159/62 (BP Location: Right Arm)   Pulse 83   Temp 98.5 F (36.9 C)  (Oral)   Resp 17   Ht 5\' 1"  (1.549 m)   SpO2 100%   BMI 20.78 kg/m   Physical Exam  Constitutional: She is oriented to person, place, and time. She appears well-developed and well-nourished. No distress.  HENT:  Head: Normocephalic and atraumatic.  Eyes: Conjunctivae are normal. Pupils are equal, round, and reactive to light. Right eye exhibits no discharge. Left eye exhibits no discharge. No scleral icterus.  Neck: Normal range of motion.  Cardiovascular: Normal rate and regular rhythm. Exam reveals no gallop and no friction rub.  No murmur heard. Pulmonary/Chest: Effort normal and breath sounds normal. No stridor. No respiratory distress. She has no wheezes. She has no rales. She exhibits  no tenderness.  Abdominal: She exhibits no distension.  Musculoskeletal:  Left wrist: Mild swelling over medial wrist. No deformity, or warmth. Diffuse tenderness of distal forearm and wrist. No tenderness of elbow or fingers. FROM of shoulder, elbow, and able to wiggle fingers. N/V intact.  Back: Surgical scar over lumbar spine which is healing well. No tenderness   Neurological: She is alert and oriented to person, place, and time.  Skin: Skin is warm and dry.  Psychiatric: She has a normal mood and affect. Her behavior is normal.  Nursing note and vitals reviewed.    ED Treatments / Results  Labs (all labs ordered are listed, but only abnormal results are displayed) Labs Reviewed - No data to display  EKG  EKG Interpretation None       Radiology Dg Lumbar Spine Complete  Result Date: 07/06/2017 CLINICAL DATA:  Persistent back pain after falling at home today. Surgery 4 months ago. EXAM: LUMBAR SPINE - COMPLETE 4+ VIEW COMPARISON:  MRI 02/09/2017, CT 02/27/2016, intraprocedural images 03/19/17. FINDINGS: Pedicle screws with vertical interconnecting hardware at L4-5, intact and unchanged from 03/19/2017. Intervertebral cage device at L4-5, unchanged. No evidence of hardware failure.  The lumbar vertebrae are normal in height. Mild anterolisthesis of L4, unchanged. Moderate degenerative disc and facet changes. Mild right convex curvature, unchanged from 02/27/2016. No evidence of acute lumbar spine fracture. Sacroiliac joints appear intact. IMPRESSION: Hardware is intact and unchanged. No evidence of acute lumbar spine fracture. Unchanged curvature, degenerative changes and spondylolisthesis. Electronically Signed   By: Andreas Newport M.D.   On: 07/06/2017 21:41   Dg Wrist Complete Left  Result Date: 07/06/2017 CLINICAL DATA:  Persistent left wrist pain after falling at home today. EXAM: LEFT WRIST - COMPLETE 3+ VIEW COMPARISON:  03/06/2007 FINDINGS: There is an acute intra-articular distal radius fracture with mild dorsal displacement and angulation. There is an acute nondisplaced ulnar styloid tip fracture. No dislocation. Mild to moderate arthritic changes at the triscaphe articulations and at the first [facility]. IMPRESSION: Acute fractures of the distal radius and ulnar styloid. Electronically Signed   By: Andreas Newport M.D.   On: 07/06/2017 21:37    Procedures .Splint Application Date/Time: 01/60/1093 12:13 AM Performed by: Recardo Evangelist, PA-C Authorized by: Recardo Evangelist, PA-C   Consent:    Consent obtained:  Verbal   Consent given by:  Patient   Risks discussed:  Pain   Alternatives discussed:  No treatment Pre-procedure details:    Sensation:  Normal Procedure details:    Laterality:  Left   Location:  Arm   Arm:  L lower arm   Strapping: no     Cast type:  Short arm   Splint type:  Sugar tong   Supplies:  Prefabricated splint Post-procedure details:    Pain:  Unchanged   Sensation:  Normal   Patient tolerance of procedure:  Tolerated well, no immediate complications   (including critical care time)   Medications Ordered in ED Medications - No data to display   Initial Impression / Assessment and Plan / ED Course  I have reviewed the  triage vital signs and the nursing notes.  Pertinent labs & imaging results that were available during my care of the patient were reviewed by me and considered in my medical decision making (see chart for details).  66 year old female presents with closed distal radius fracture and ulnar styloid fracture after a mechanical fall. Lumbar films were obtained as well since she did hit  her back and she has somewhat recent surgery. Lumbar xray is normal. She was placed in a sugar tong splint. RICE protocol discussed. RX for pain medication given. Referral to orthopedics was given. Return precautions given.  Final Clinical Impressions(s) / ED Diagnoses   Final diagnoses:  Closed fracture of distal end of left radius, unspecified fracture morphology, initial encounter  Closed nondisplaced fracture of styloid process of left ulna, initial encounter    ED Discharge Orders    None       Recardo Evangelist, PA-C 07/07/17 0014    Virgel Manifold, MD 07/07/17 1930

## 2017-07-06 NOTE — ED Notes (Addendum)
Patient is A&Ox4 at this time.  Please see providers note for complete history and physical exam.

## 2017-07-06 NOTE — Progress Notes (Signed)
Orthopedic Tech Progress Note Patient Details:  Stephanie Powers 1950/10/17 [id number]  Ortho Devices Type of Ortho Device: Arm sling, Sugartong splint Ortho Device/Splint Location: lue Ortho Device/Splint Interventions: Ordered, Application, Adjustment   Post Interventions Patient Tolerated: Well Instructions Provided: Care of device, Adjustment of device   Karolee Stamps 07/06/2017, 10:27 PM

## 2017-07-06 NOTE — Discharge Instructions (Signed)
Ice - ice for 20 minutes at a time, several times a day Elevate - try to keep arm elevated to prevent worsening swelling Ibuprofen or Naproxen - take with food. Follow up with orthopedics Return if worsening

## 2017-07-06 NOTE — ED Triage Notes (Signed)
Pt c/o left wrist pain and swelling after a fall 1 hour PTA.

## 2017-07-06 NOTE — ED Notes (Signed)
Ortho has been paged. 

## 2017-07-06 NOTE — ED Notes (Signed)
Pt verbalized understanding of d/c instructions and has no further questions. Pt is stable, A&Ox4, VSS.  

## 2017-07-08 ENCOUNTER — Encounter: Payer: Self-pay | Admitting: *Deleted

## 2017-07-08 ENCOUNTER — Other Ambulatory Visit: Payer: Self-pay | Admitting: *Deleted

## 2017-07-08 NOTE — Patient Outreach (Signed)
[facility]) Care Management  07/08/2017   Stephanie Powers [birth year] [id number]  Telephone Screen Referral Source:  [facility] ED Census Reason for Referral:  6 or more ED visits in the past 6 months Insurance:  [employer]   Outreach Attempt:  Successful telephone outreach to patient.  HIPAA verified.  Telephone screening completed with patient.  Social:  Patient lives at home with significant other.  Reports being independent with ADLs and IADLs.  Does not specify if there is a caregiver available if needed.  States she continues to drive and self transports to medical appointments.  Patient reports available DME in the home include: rolling walker, bedside commode, and shower chair.  States she ambulates independently.  Conditions:  Per chart review and discussion with patient, PMH include: COPD, osteoporosis, recent lumbar fusion, arthritis, depression, GERD, hypercholesterolemia, heart murmur, dysrhythmia, and current left wrist fracture after recent fall.  Reports left wrist is in a splint.  States she has attempted to make appointment with Orthopedic and is unable to reach them or receive a telephone call back.  Patient reports she has an appointment with her primary care provider tomorrow since she has not received any communication from messages left for the orthopedic.  States she receives therapy for her depression.  Medications:  Patient reports taking 7 medications and denies any trouble affording medications.  Appointments:  Reports seeing her primary health care provider, Cyndi Bender [facility] 4 months ago.  Has follow up appointment post ER visit on tomorrow, 07/09/2017.  Awaiting telephone call back from orthopedic concerning wrist fracture.  Advanced Directives:  Patient denies having advance directive in place and declines information on creating advance directive.   Consent:  Reviewed and discussed [facility] services with patient.  Patient verbally agrees to  [place] for in home safety evaluation and assessment of care coordination needs.  Plan: RN Health Coach placed [facility] Community RN referral. [place] notified [facility] administrative assistant of case status. RN Health Coach to send patient Successful Outreach Letter.  [place] [phone] [email or web address]

## 2017-07-09 DIAGNOSIS — M48061 Spinal stenosis, lumbar region without neurogenic claudication: Secondary | ICD-10-CM | POA: Diagnosis not present

## 2017-07-09 DIAGNOSIS — S52509A Unspecified fracture of the lower end of unspecified radius, initial encounter for closed fracture: Secondary | ICD-10-CM | POA: Diagnosis not present

## 2017-07-09 DIAGNOSIS — S52613A Displaced fracture of unspecified ulna styloid process, initial encounter for closed fracture: Secondary | ICD-10-CM | POA: Diagnosis not present

## 2017-07-09 DIAGNOSIS — Z6821 Body mass index (BMI) 21.0-21.9, adult: Secondary | ICD-10-CM | POA: Diagnosis not present

## 2017-07-09 DIAGNOSIS — F17200 Nicotine dependence, unspecified, uncomplicated: Secondary | ICD-10-CM | POA: Diagnosis not present

## 2017-07-09 DIAGNOSIS — J449 Chronic obstructive pulmonary disease, unspecified: Secondary | ICD-10-CM | POA: Diagnosis not present

## 2017-07-10 ENCOUNTER — Other Ambulatory Visit: Payer: Self-pay | Admitting: *Deleted

## 2017-07-10 DIAGNOSIS — S52602A Unspecified fracture of lower end of left ulna, initial encounter for closed fracture: Secondary | ICD-10-CM | POA: Diagnosis not present

## 2017-07-10 DIAGNOSIS — S52502A Unspecified fracture of the lower end of left radius, initial encounter for closed fracture: Secondary | ICD-10-CM | POA: Diagnosis not present

## 2017-07-10 DIAGNOSIS — M25532 Pain in left wrist: Secondary | ICD-10-CM | POA: Diagnosis not present

## 2017-07-10 NOTE — Patient Outreach (Addendum)
[facility]) Care Management  07/10/2017  Stephanie Powers 17-Sep-1950 [id number]   Referral source; Telephonic health coach from [facility] ED census Reason for referral : Home safety evaluation and care coordination needs.  Dx: Recent ED visit after fall and left wrist fracture. PMHx: includes but not limited to  COPD, osteoporosis , GERD, hypercholesterolemia .  Placed initial call to patient, HIPAA information verified, explained reason for the call. Patient reports she feels lousy, states she is presently in doctor office getting splint placed on wrist.    Plan Will plan return call in the next week, for assessment and schedule home visit l    Joylene Draft, RN, [place] Management Coordinator  [phone]- Mobile [phone]- [place]

## 2017-07-13 ENCOUNTER — Other Ambulatory Visit: Payer: Self-pay | Admitting: *Deleted

## 2017-07-13 NOTE — Patient Outreach (Signed)
[facility]) Care Management  07/13/2017  VESTER TITSWORTH February 21, 1951 [id number]  Telephone outreach cal lReferral source; Telephonic health coach from [facility] ED census Reason for referral : Home safety evaluation and care coordination needs.  Dx: Recent ED visit after fall and left wrist fracture. PMHx: includes but not limited to  COPD, osteoporosis , GERD, hypercholesterolemia   Placed call to patient , HIPAA information verified, explained reason for the call, patient reports this is not a good time to talk, requested return call in the next week Briefly Patient discussed her recent fracture to wrist and now she has a cast on . Patient reports she does have support at home to help as needed. She reports pain in controlled with medications that she has, declined review of current medications.   Plan Will plan return call to patient in the next week as agreed to begin assessment, plan and program.    Joylene Draft, RN, [place] Management Coordinator  [phone]- Mobile [phone]- [place]

## 2017-07-16 ENCOUNTER — Ambulatory Visit (INDEPENDENT_AMBULATORY_CARE_PROVIDER_SITE_OTHER): Payer: PPO | Admitting: Ophthalmology

## 2017-07-17 DIAGNOSIS — M25532 Pain in left wrist: Secondary | ICD-10-CM | POA: Diagnosis not present

## 2017-07-20 ENCOUNTER — Other Ambulatory Visit: Payer: Self-pay | Admitting: *Deleted

## 2017-07-20 ENCOUNTER — Encounter: Payer: Self-pay | Admitting: *Deleted

## 2017-07-20 DIAGNOSIS — S62102A Fracture of unspecified carpal bone, left wrist, initial encounter for closed fracture: Secondary | ICD-10-CM | POA: Insufficient documentation

## 2017-07-20 NOTE — Patient Outreach (Signed)
[facility]) Care Management  07/20/2017  Stephanie Powers 03-11-1951 [id number]  Telephone follow up   Dx: Recent ED visit after fall and left wrist fracture. PMHx: includes but not limited to  COPD, osteoporosis , GERD, hypercholesterolemia   Successful outreach call to patient HIPAA identifiers verified. Explained reason for follow up call, patient gave verbal agreement for [facility] care management services, assessments and follow up . Patient discussed accidental fall while decorating her christmas tree.  Patient discussed awaiting follow up call from orthopedic office regarding next appointment office to call her. Patient reports she will have someone to help her with  getting to next appointment in [place], denies transportation services.  Patient discussed difficulties with eating with right hand, but getting somewhat used to it ,  able to manage showering on her own, getting plastic bag over wrist. Patient reports having a walker and cane but not having to use since back surgery 4 months ago. Patient reports she does have someone at home that is able to assist as needed.   During conversation patient abruptly states she has to go, agreeing to receiving a  call in the next week.    Plan Will plan follow up call in the next week for follow up on 30 day assessment and discuss home visit for assessment and home evaluation .  Will send PCP barrier letter, and patient welcome letter.   Joylene Draft, RN, [place] Management Coordinator  [phone]- Mobile [phone]- Toll Free Main Office

## 2017-07-29 DIAGNOSIS — S52532G Colles' fracture of left radius, subsequent encounter for closed fracture with delayed healing: Secondary | ICD-10-CM | POA: Diagnosis not present

## 2017-07-30 ENCOUNTER — Other Ambulatory Visit: Payer: Self-pay | Admitting: *Deleted

## 2017-07-30 NOTE — Patient Outreach (Addendum)
[place] [facility]) Care Management  07/30/2017  Stephanie Powers 1951-07-24 [id number]   Telephone follow up call  Dx: Recent ED visit after fall and left wrist fracture. PMHx: includes but not limited to  COPD, osteoporosis , GERD, hypercholesterolemia  Unsuccessful telephone outreach call to patient, attempted home and mobile number, able to leave a HIPAA compliant voice mail message requesting a return call.    Plan Will await return call ,if no response plan return call within a week.   Joylene Draft, RN, [place] Management Coordinator  [phone]- Mobile [phone]- Toll Free Main Office

## 2017-08-04 DIAGNOSIS — J449 Chronic obstructive pulmonary disease, unspecified: Secondary | ICD-10-CM | POA: Diagnosis not present

## 2017-08-04 DIAGNOSIS — K2270 Barrett's esophagus without dysplasia: Secondary | ICD-10-CM | POA: Diagnosis not present

## 2017-08-04 DIAGNOSIS — Z23 Encounter for immunization: Secondary | ICD-10-CM | POA: Diagnosis not present

## 2017-08-04 DIAGNOSIS — Z6821 Body mass index (BMI) 21.0-21.9, adult: Secondary | ICD-10-CM | POA: Diagnosis not present

## 2017-08-04 DIAGNOSIS — S5290XA Unspecified fracture of unspecified forearm, initial encounter for closed fracture: Secondary | ICD-10-CM | POA: Diagnosis not present

## 2017-08-04 DIAGNOSIS — F329 Major depressive disorder, single episode, unspecified: Secondary | ICD-10-CM | POA: Diagnosis not present

## 2017-08-04 DIAGNOSIS — L309 Dermatitis, unspecified: Secondary | ICD-10-CM | POA: Diagnosis not present

## 2017-08-04 DIAGNOSIS — M48061 Spinal stenosis, lumbar region without neurogenic claudication: Secondary | ICD-10-CM | POA: Diagnosis not present

## 2017-08-04 DIAGNOSIS — Z139 Encounter for screening, unspecified: Secondary | ICD-10-CM | POA: Diagnosis not present

## 2017-08-06 ENCOUNTER — Other Ambulatory Visit: Payer: Self-pay | Admitting: *Deleted

## 2017-08-06 NOTE — Patient Outreach (Signed)
[facility]) Care Management  08/06/2017  Stephanie Powers May 31, 1951 [id number]   Telephone follow up call   Dx: Recent ED visit after fall and left wrist fracture.  PMHx: includes but not limited to  COPD, osteoporosis , GERD, hypercholesterolemia, depression .    Successful outreach call to patient, HIPAA information verified.   Patient discussed she is feeling much better less anxious than a few weeks ago after fall while decorating christmas tree and left wrist fracture.  Patient discussed being excited about getting cast off on tomorrow and looking forward to being able to get back to usual activity.Discussed visit with PCP on yesterday.   Patient briefly discussed diagnosis of COPD , reports that she has her rescue inhaler , does not require daily use, denies increase cough or shortness breath.  Patient denies being familiar with COPD zone information and action plan, she is agreeable to receiving education .  Patient reports taking medications as prescribed and denies cost concerns .    Discussed home visit with patient she is now  agreeable, in the next  Week, unable to complete home visit within 30 days of start of program.    Plan  Will schedule initial home visit in the next week.  Joylene Draft, RN, [place] Management Coordinator  [phone]- Mobile [phone]- Toll Free Main Office

## 2017-08-07 DIAGNOSIS — M25532 Pain in left wrist: Secondary | ICD-10-CM | POA: Diagnosis not present

## 2017-08-07 DIAGNOSIS — S52532G Colles' fracture of left radius, subsequent encounter for closed fracture with delayed healing: Secondary | ICD-10-CM | POA: Diagnosis not present

## 2017-08-12 ENCOUNTER — Encounter: Payer: Self-pay | Admitting: *Deleted

## 2017-08-12 ENCOUNTER — Other Ambulatory Visit: Payer: Self-pay | Admitting: *Deleted

## 2017-08-12 DIAGNOSIS — F32A Depression, unspecified: Secondary | ICD-10-CM | POA: Insufficient documentation

## 2017-08-12 DIAGNOSIS — Z8709 Personal history of other diseases of the respiratory system: Secondary | ICD-10-CM | POA: Insufficient documentation

## 2017-08-12 DIAGNOSIS — F329 Major depressive disorder, single episode, unspecified: Secondary | ICD-10-CM | POA: Insufficient documentation

## 2017-08-12 NOTE — Patient Outreach (Addendum)
[facility]) Care Management   08/13/2017  SPECIAL RANES 1951-06-27 [id number]  Stephanie Powers is an 67 y.o. female  Dx: ED visit on 12/10  after fall and left wrist fracture.  PMHx: includes but not limited to  COPD, osteoporosis , GERD, hypercholesterolemia, depression .    Subjective:  Today is a better today, feeling much better. Patient discussed being happy to get cast off left arm, but still limited by how much she can use arm and states she will know more after her next visit about whether she will need therapy.    Objective:  BP 122/80 (BP Location: Right Arm, Patient Position: Sitting, Cuff Size: Normal)   Pulse (!) 110   Resp 18   Ht 1.549 m (_0 )   Wt 113 lb (51.3 kg)   SpO2 98%   BMI 21.35 kg/m  Review of Systems  Constitutional: Negative.   HENT: Negative.   Eyes: Negative.   Respiratory: Positive for sputum production.        Occasion cough- white sputum  Cardiovascular: Negative.   Gastrointestinal: Positive for diarrhea and heartburn.  Genitourinary: Negative.   Musculoskeletal: Positive for joint pain.       Hands back   Skin: Negative.   Neurological: Positive for dizziness.  Endo/Heme/Allergies: Negative.   Psychiatric/Behavioral: Negative.     Physical Exam  Constitutional: She is oriented to person, place, and time. She appears well-developed and well-nourished.  Cardiovascular: Normal rate and normal heart sounds.  Respiratory: Effort normal.  GI: Soft.  Neurological: She is alert and oriented to person, place, and time.  Skin: Skin is warm and dry.  Psychiatric: She has a normal mood and affect. Her behavior is normal. Judgment and thought content normal.    Encounter Medications:   Outpatient Encounter Medications as of 08/12/2017  Medication Sig  . albuterol (PROVENTIL HFA;VENTOLIN HFA) 108 (90 BASE) MCG/ACT inhaler Inhale 1-2 puffs into the lungs every 6 (six) hours as needed for wheezing or shortness of breath.   Marland Kitchen atorvastatin (LIPITOR) 40 MG tablet Take 40 mg by mouth every morning.  . cetirizine (ZYRTEC) 10 MG tablet Take 20 mg by mouth daily.   Marland Kitchen dicyclomine (BENTYL) 10 MG capsule Take 20 mg by mouth 2 (two) times daily before a meal.  . HYDROcodone-acetaminophen (NORCO/VICODIN) 5-325 MG tablet Take 1 tablet by mouth every 4 (four) hours as needed.  . Liniments (SALONPAS ARTHRITIS PAIN RELIEF) PADS Apply 1 each topically daily as needed (pain).   . pantoprazole (PROTONIX) 40 MG tablet Take 40 mg by mouth 2 (two) times daily with a meal.  . Polyvinyl Alcohol-Povidone (REFRESH OP) Place 1 drop into both eyes every 4 (four) hours as needed (dry eyes).  . sertraline (ZOLOFT) 100 MG tablet Take 150 mg by mouth daily.   . traZODone (DESYREL) 100 MG tablet Take 100 mg by mouth at bedtime.  . triamcinolone cream (KENALOG) 0.1 % Apply 1 application topically at bedtime as needed (rash).    No facility-administered encounter medications on file as of 08/12/2017.     Functional Status:   In your present state of health, do you have any difficulty performing the following activities: 07/20/2017 03/16/2017  Hearing? N N  Vision? Y Y  Difficulty concentrating or making decisions? N Y  Walking or climbing stairs? N Y  Dressing or bathing? Y N  Doing errands, shopping? Y -  Comment difficulty with distances since wrist fracture, family/friend assistance  -  Preparing Food  and eating ? N -  Using the Toilet? N -  In the past six months, have you accidently leaked urine? N -  Do you have problems with loss of bowel control? N -  Managing your Medications? N -  Managing your Finances? N -  Housekeeping or managing your Housekeeping? Y -  Comment has someone that can assist if needed -  Some recent data might be hidden    Fall/Depression Screening:    Fall Risk  07/20/2017  Falls in the past year? Yes  Number falls in past yr: 1  Injury with Fall? Yes  Risk for fall due to : History of fall(s)  Follow  up Falls prevention discussed   PHQ 2/9 Scores 08/12/2017 08/06/2017 07/08/2017  PHQ - 2 Score _0 PHQ- 9 Score 6 - -    Assessment:  Initial home visit.   Fall , Left wrist fracture Cast has been removed, patient wearing a splint still limitation on movement. Has follow up appointment with orthopedic MD, for next steps. Home safety evaluation, patient independent with ambulation, steady gait, no scatter rugs noted, reports occasional dizziness.   COPD Everyday smoker , thinking about quitting smoking but hasn't made up her mind. Agreeable to receiving information on quit smoking.Denies recent use of rescue inhaler. Patient not aware of COPD zone information, agreeable to education .   Discussed recent weight, and appetite changes, reinforced use of nutrition drink boost as supplement, reviewed benefit of healthy balanced diet.   Depression - positive screen , reports taking medication as prescribed. Does not see a counselor, declines need at this time.Reports emotions improved since cast removed.    Plan:  Provided and reviewed THN welcome packet,  Provided EMMI handouts on COPD when to get help, COPD packet and thinking about quit smoking handout . Will follow up with patient in the next 2 weeks.by telephone visit.  Will send PCP office this home visit note.   THN CM Care Plan Problem One     Most Recent Value  Care Plan Problem One  At risk for fall related to recent fall with left wrist fracture   Role Documenting the Problem One  Care Management [place] for Problem One  Active  [facility] Long Term Goal   Patient will reports increased knowledge of fall prevention measures as evidenced by report in the next 60  days    THN Long Term Goal Start Date  07/20/17  Interventions for Problem One Long Term Goal  Home visit completed,   [facility] CM Short Term Goal #1   Patient will report attending all MD appointments in the next 21 days   THN CM Short Term Goal #1 Start Date   07/20/17  [facility] CM Short Term Goal #1 Met Date  08/13/17  THN CM Short Term Goal #2   Patient will report no falls in the next 30 days   THN CM Short Term Goal #2 Start Date  07/20/17  Interventions for Short Term Goal #2  Reviewed with teachback fall preventions measures, reviewed check for safety handout.,reviewed recent fall history, home safety evaluation      THN CM Care Plan Problem Two     Most Recent Value  Care Plan Problem Two  Knowledge related to self care management of COPD  Role Documenting the Problem Two  Care Management [place] for Problem Two  Active  Interventions for Problem Two Long Term Goal   RNCM  reviewed current symptoms status, verified knowledge of how to use rescue inhaler  THN Long Term Goal  Patient will be able to verbalize increased knowledge of COPD self care managment  in the next 60 days   THN Long Term Goal Start Date  08/13/17  [facility] CM Short Term Goal #1   Patient will be able to verbalize recognition of yellow zone symptoms in the next 30 days   THN CM Short Term Goal #1 Start Date  08/13/17  Interventions for Short Term Goal #2   RNCM provided COPD packet,  reviewed zone chart, will follow up review at next visit   THN CM Short Term Goal #2   Patient will be able to verbalize actions to take for worsening COPD symptoms in the next 30 days   THN CM Short Term Goal #2 Start Date  08/13/17  Interventions for Short Term Goal #2  Provided COPD magnet, reviewed patient current zone symptoms and actions plan for worsening symptoms .   THN CM Short Term Goal #3   Patient will be able to report increased knowledge of quitting smoking benefits in managing COPD  in the next 30 days   THN CM Short Term Goal #3 Start Date  08/13/17  Interventions for Short Term Goal #3  provided thinking about quitting smoking handout for patient to read, will review at next visit, disucssed resources classes available. in area.       Joylene Draft, RN, [place] Management Coordinator  [phone]- Mobile [phone]- Toll Free Main Office

## 2017-08-26 ENCOUNTER — Other Ambulatory Visit: Payer: Self-pay | Admitting: *Deleted

## 2017-08-26 NOTE — Patient Outreach (Signed)
[place] [facility]) Care Management  08/26/2017  Stephanie Powers [birth year] [id number]  Telephone follow up call  Successful outreach call  to patient, HIPAA information verified ,  she reports she is doing pretty good .  COPD Patient discussed use of albuterol inhaler twice within the last week, for wheezing reports good relief. Reports some coughing with white phelgm results, denies increase in shortness of breath.  Patient continues to smoke , declines being ready to quit at this time.  Patient reports she continues to drink boost daily one to two, as ",appetite has never been to good", feels like she has gained a few pounds and that will make everyone happy.   Recent history fracture left wrist after a fall  Patient reports has follow up visit with orthopedic doctor on this week, and will find out next step regarding , physical therapy plan.  Patient denies recent fall, tolerating activity in home.  Patient states her anxiety level is under better control, than when she first experienced fracture.   Patient denies any new concerns at this time.   Plan Will follow up with patient by telephone in the next week , to plan next home visit after she gets plans for therapy.    Joylene Draft, RN, [place] Management Coordinator  [phone]- Mobile [phone]- Toll Free Main Office

## 2017-08-28 DIAGNOSIS — M25532 Pain in left wrist: Secondary | ICD-10-CM | POA: Diagnosis not present

## 2017-08-28 DIAGNOSIS — S52532G Colles' fracture of left radius, subsequent encounter for closed fracture with delayed healing: Secondary | ICD-10-CM | POA: Diagnosis not present

## 2017-09-02 DIAGNOSIS — H40033 Anatomical narrow angle, bilateral: Secondary | ICD-10-CM | POA: Diagnosis not present

## 2017-09-02 DIAGNOSIS — H04123 Dry eye syndrome of bilateral lacrimal glands: Secondary | ICD-10-CM | POA: Diagnosis not present

## 2017-09-03 ENCOUNTER — Other Ambulatory Visit: Payer: Self-pay | Admitting: *Deleted

## 2017-09-03 NOTE — Patient Outreach (Signed)
[place] [facility]) Care Management  09/03/2017  Stephanie Powers 11-23-1950 [id number]  Telephone Follow up call  Successful outreach call, HIPAA confirmed.  Patient discussed she is doing pretty good, resting at this time.  COPD Patient denies having in episode of increased shortness of breath, using rescue inhaler about one to two times a week with relief.   Left Wrist fracture Recent follow up with orthopedic, no new changes,still wearing splint, reports she has follow up visit in the next 2 weeks and will hopefully know more about next steps .  Patient denies any new concerns at time, discussed follow up home visit,  agreeable to call in the next 2 weeks, states she doesn't have much time to talk at this time.   Plan Will plan follow up call in the next 2 weeks and further assess ongoing care management needs.   Joylene Draft, RN, [place] Management Coordinator  [phone]- Mobile [phone]- Toll Free Main Office

## 2017-09-15 DIAGNOSIS — J019 Acute sinusitis, unspecified: Secondary | ICD-10-CM | POA: Diagnosis not present

## 2017-09-15 DIAGNOSIS — R42 Dizziness and giddiness: Secondary | ICD-10-CM | POA: Diagnosis not present

## 2017-09-22 ENCOUNTER — Other Ambulatory Visit: Payer: Self-pay | Admitting: *Deleted

## 2017-09-22 NOTE — Patient Outreach (Signed)
[place] [facility]) Care Management  09/22/2017  Stephanie Powers 1951-05-24 [id number]  Telephone assessment  Successful outreach call to patient, HIPAA verified. Patient reports feeling better than she did on last week.  Patient discussed having respiratory "crud" on last week, reports notifying PCP and arranging office visit.  Patient reports being on antibiotics, using her albuterol inhaler at least twice daily  as needed. Patient reports she still continues to smoke.    Nutrition. Patient reports appetite has been a less than it usually has been  since she has been sick, Patient continues to drink 1 boost a day between meals, reports her weight had not changed much at visit last week, 114, down from 116 2 months ago.  Hx fall while trimming christmas tree  and fracture left wrist  Patient reports she has follow up visit to orthopedic MD regarding right wrist area. Patient states she is able continue usual activities at home.   Assessment  Patient agreeable to home visit for education support on COPD management  Discussed with patient our telephonic health coach program for continued education/support  for chronic disease once community goals met and no further are coordination needs, she has agreed to consider.   Patient denies any new concerns or needs at this time.   Plan RN will schedule home visit in the next month.  RN reviewed with patient notifying MD of unresolved respiratory symptoms, increased shortness of breath, cough , change in sputum color.  RN reinforced with patient completing full antibiotic prescriptions .     THN CM Care Plan Problem One     Most Recent Value  Care Plan Problem One  At risk for fall related to recent fall with left wrist fracture   Role Documenting the Problem One  Care Management [place] for Problem One  Not Active  [facility] Long Term Goal   Patient will reports increased knowledge of fall prevention measures as  evidenced by report in the next 60  days    THN Long Term Goal Start Date  07/20/17  [facility] Long Term Goal Met Date  09/22/17  [facility] CM Short Term Goal #1   Patient will report attending all MD appointments in the next 21 days   THN CM Short Term Goal #1 Start Date  07/20/17  [facility] CM Short Term Goal #1 Met Date  08/13/17  THN CM Short Term Goal #2   Patient will report no falls in the next 30 days   THN CM Short Term Goal #2 Start Date  07/20/17  [facility] CM Short Term Goal #2 Met Date  08/20/17    [facility] CM Care Plan Problem Two     Most Recent Value  Care Plan Problem Two  Knowledge related to self care management of COPD  Role Documenting the Problem Two  Care Management Coordinator  Care Plan for Problem Two  Active  Interventions for Problem Two Long Term Goal   Reviewed current clinical state, reinforced continued follow up with PCP with worsening yellow zone symptoms, encouarge to complete course of antibiotic.   THN Long Term Goal  Patient will be able to verbalize increased knowledge of COPD self care managment  in the next 60 days   THN Long Term Goal Start Date  08/13/17  [facility] CM Short Term Goal #1   Patient will be able to verbalize recognition of yellow zone symptoms in the next 30 days   THN CM Short Term Goal #1 Start  Date  08/13/17  THN CM Short Term Goal #1 Met Date   09/03/17  THN CM Short Term Goal #2   Patient will be able to verbalize actions to take for worsening COPD symptoms in the next 30 days   THN CM Short Term Goal #2 Start Date  08/13/17  [facility] CM Short Term Goal #2 Met Date  09/22/17  [facility] CM Short Term Goal #3   Patient will be able to report increased knowledge of quitting smoking benefits in managing COPD  in the next 30 days   THN CM Short Term Goal #3 Start Date  08/13/17  Interventions for Short Term Goal #3  Discussed benefits of quitting smoking.        Joylene Draft, RN, [place] Management Coordinator  [phone]- Mobile [phone]-  Toll Free Main Office

## 2017-09-25 DIAGNOSIS — M25532 Pain in left wrist: Secondary | ICD-10-CM | POA: Diagnosis not present

## 2017-09-25 DIAGNOSIS — S52532G Colles' fracture of left radius, subsequent encounter for closed fracture with delayed healing: Secondary | ICD-10-CM | POA: Diagnosis not present

## 2017-10-07 ENCOUNTER — Ambulatory Visit: Payer: Self-pay | Admitting: *Deleted

## 2017-10-14 ENCOUNTER — Other Ambulatory Visit: Payer: Self-pay | Admitting: *Deleted

## 2017-10-14 ENCOUNTER — Ambulatory Visit: Payer: Self-pay | Admitting: *Deleted

## 2017-10-14 NOTE — Patient Outreach (Addendum)
[place] [facility]) Care Management  10/14/2017  KLYNN LINNEMANN 1951-04-20 [id number]   Telephone assessment   Late entry of 10/13/17 Received message from [place] that patient requested to cancel home visit for 3/20 and to have return call.  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 Patient agreeable to return call in the next week to discuss further home visit plan, unable to discuss at this time and address care goals.  Plan return call in next week.   Joylene Draft, RN, [place] Management Coordinator  [phone]- Mobile [phone]- Toll Free Main Office

## 2017-10-21 ENCOUNTER — Encounter: Payer: Self-pay | Admitting: *Deleted

## 2017-10-21 ENCOUNTER — Other Ambulatory Visit: Payer: Self-pay | Admitting: *Deleted

## 2017-10-21 NOTE — Patient Outreach (Signed)
[place] [facility]) Care Management  10/21/2017  Stephanie Powers June 13, 1951 [id number]  Telephone follow up call   Successful outreach call to patient , HIPAA information verified, patient  reports she is doing fine. 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   Patient discussed her condition of  COPD Reports she is managing well no increase in shortness of breath cough, denies change in sputum, patient able to state yellow zone symptoms action plan. Patient reports using her rescue inhaler about once daily with good response. Patient continues to smoke and declines being ready to quit.  Patient discussed she is know how to handle things when she has problems with breathing, she reports she calls MD if when she notices changes in condition. Patient continues with trying to drink boost nutrition supplement to help with decrease appetite at times, she declined discussing weight.  Patient declines need for continued follow up,needs for home visit offered telephonic health coach follow up for continued education and support of COPD she has declined at this time and is agreeable to case closure.  Patient has consistent PCP follow up and has not experienced a hospital admission in the last 3 months.   Falls risk Patient denies having recurrent fall in the last 3 months .   Plan  RN CM will plan case closure, verified patient has [facility] contact information for future needs.   RN CM will send PCP case closure letter .    Joylene Draft, RN, [place] Management Coordinator  [phone]- Mobile [phone]- Toll Free Main Office

## 2017-11-10 DIAGNOSIS — K589 Irritable bowel syndrome without diarrhea: Secondary | ICD-10-CM | POA: Diagnosis not present

## 2017-11-10 DIAGNOSIS — F329 Major depressive disorder, single episode, unspecified: Secondary | ICD-10-CM | POA: Diagnosis not present

## 2017-11-10 DIAGNOSIS — K2270 Barrett's esophagus without dysplasia: Secondary | ICD-10-CM | POA: Diagnosis not present

## 2017-11-10 DIAGNOSIS — Z6821 Body mass index (BMI) 21.0-21.9, adult: Secondary | ICD-10-CM | POA: Diagnosis not present

## 2017-11-10 DIAGNOSIS — M48061 Spinal stenosis, lumbar region without neurogenic claudication: Secondary | ICD-10-CM | POA: Diagnosis not present

## 2017-11-10 DIAGNOSIS — Z79899 Other long term (current) drug therapy: Secondary | ICD-10-CM | POA: Diagnosis not present

## 2017-11-10 DIAGNOSIS — E7800 Pure hypercholesterolemia, unspecified: Secondary | ICD-10-CM | POA: Diagnosis not present

## 2017-11-10 DIAGNOSIS — E2839 Other primary ovarian failure: Secondary | ICD-10-CM | POA: Diagnosis not present

## 2017-11-18 DIAGNOSIS — S52532G Colles' fracture of left radius, subsequent encounter for closed fracture with delayed healing: Secondary | ICD-10-CM | POA: Diagnosis not present

## 2017-11-18 DIAGNOSIS — M25532 Pain in left wrist: Secondary | ICD-10-CM | POA: Diagnosis not present

## 2017-12-14 DIAGNOSIS — E2839 Other primary ovarian failure: Secondary | ICD-10-CM | POA: Diagnosis not present

## 2017-12-14 DIAGNOSIS — Z780 Asymptomatic menopausal state: Secondary | ICD-10-CM | POA: Diagnosis not present

## 2017-12-16 DIAGNOSIS — D649 Anemia, unspecified: Secondary | ICD-10-CM | POA: Diagnosis not present

## 2017-12-25 DIAGNOSIS — M25532 Pain in left wrist: Secondary | ICD-10-CM | POA: Diagnosis not present

## 2017-12-25 DIAGNOSIS — M24832 Other specific joint derangements of left wrist, not elsewhere classified: Secondary | ICD-10-CM | POA: Diagnosis not present

## 2017-12-25 DIAGNOSIS — S52532G Colles' fracture of left radius, subsequent encounter for closed fracture with delayed healing: Secondary | ICD-10-CM | POA: Diagnosis not present

## 2018-02-16 DIAGNOSIS — F329 Major depressive disorder, single episode, unspecified: Secondary | ICD-10-CM | POA: Diagnosis not present

## 2018-02-16 DIAGNOSIS — Z1339 Encounter for screening examination for other mental health and behavioral disorders: Secondary | ICD-10-CM | POA: Diagnosis not present

## 2018-02-16 DIAGNOSIS — J449 Chronic obstructive pulmonary disease, unspecified: Secondary | ICD-10-CM | POA: Diagnosis not present

## 2018-02-16 DIAGNOSIS — Z23 Encounter for immunization: Secondary | ICD-10-CM | POA: Diagnosis not present

## 2018-02-16 DIAGNOSIS — E7800 Pure hypercholesterolemia, unspecified: Secondary | ICD-10-CM | POA: Diagnosis not present

## 2018-02-16 DIAGNOSIS — M48061 Spinal stenosis, lumbar region without neurogenic claudication: Secondary | ICD-10-CM | POA: Diagnosis not present

## 2018-02-16 DIAGNOSIS — K219 Gastro-esophageal reflux disease without esophagitis: Secondary | ICD-10-CM | POA: Diagnosis not present

## 2018-02-16 DIAGNOSIS — Z6821 Body mass index (BMI) 21.0-21.9, adult: Secondary | ICD-10-CM | POA: Diagnosis not present

## 2018-03-24 DIAGNOSIS — Z6821 Body mass index (BMI) 21.0-21.9, adult: Secondary | ICD-10-CM | POA: Diagnosis not present

## 2018-03-24 DIAGNOSIS — J019 Acute sinusitis, unspecified: Secondary | ICD-10-CM | POA: Diagnosis not present

## 2018-04-26 DIAGNOSIS — Z1331 Encounter for screening for depression: Secondary | ICD-10-CM | POA: Diagnosis not present

## 2018-04-26 DIAGNOSIS — Z136 Encounter for screening for cardiovascular disorders: Secondary | ICD-10-CM | POA: Diagnosis not present

## 2018-04-26 DIAGNOSIS — Z139 Encounter for screening, unspecified: Secondary | ICD-10-CM | POA: Diagnosis not present

## 2018-04-26 DIAGNOSIS — Z0000 Encounter for general adult medical examination without abnormal findings: Secondary | ICD-10-CM | POA: Diagnosis not present

## 2018-04-26 DIAGNOSIS — Z9181 History of falling: Secondary | ICD-10-CM | POA: Diagnosis not present

## 2018-04-26 DIAGNOSIS — E785 Hyperlipidemia, unspecified: Secondary | ICD-10-CM | POA: Diagnosis not present

## 2018-05-03 IMAGING — CR DG LUMBAR SPINE COMPLETE 4+V
5 series · 5 of 5 positions shown · non-contrast
Comparison: MRI 02/09/2017, CT 02/27/2016, intraprocedural images
03/19/17.

CLINICAL DATA: Persistent back pain after falling at home today.
Surgery 4 months ago.

EXAM:
LUMBAR SPINE - COMPLETE 4+ VIEW

[l-spine ap]
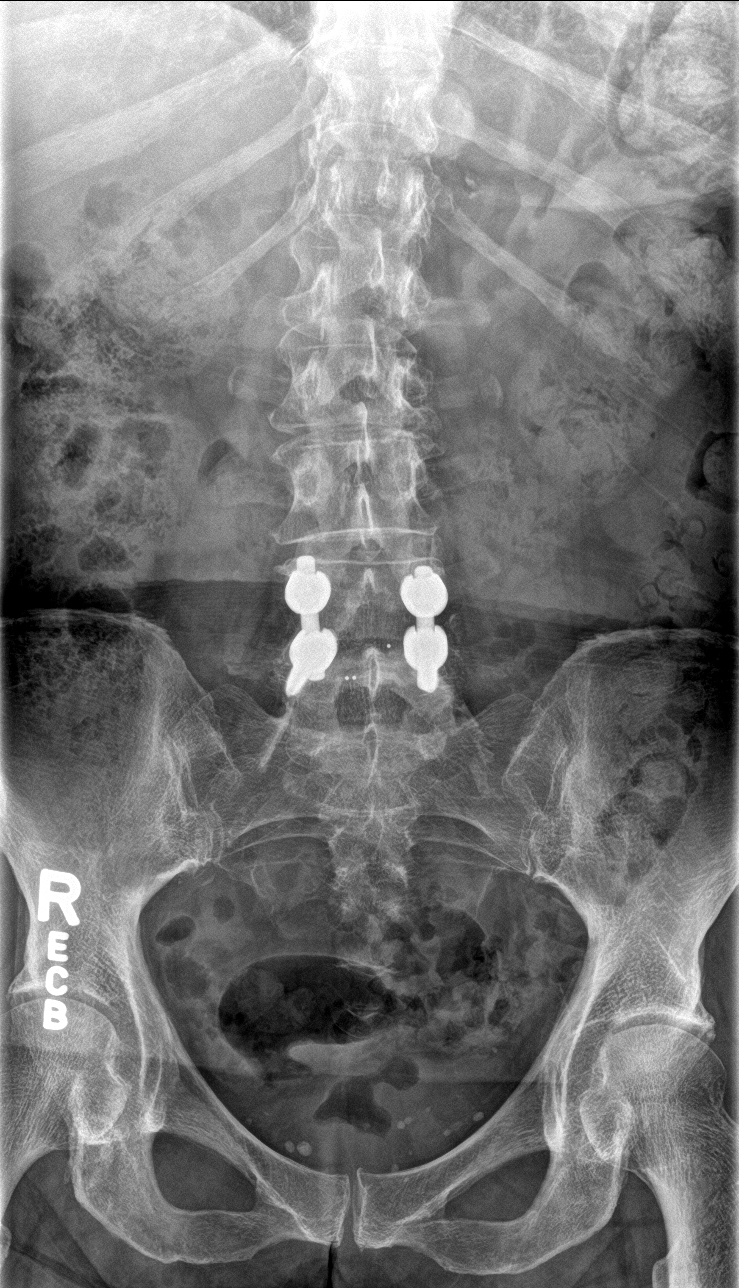

[l-spine obl (1 of 2)]
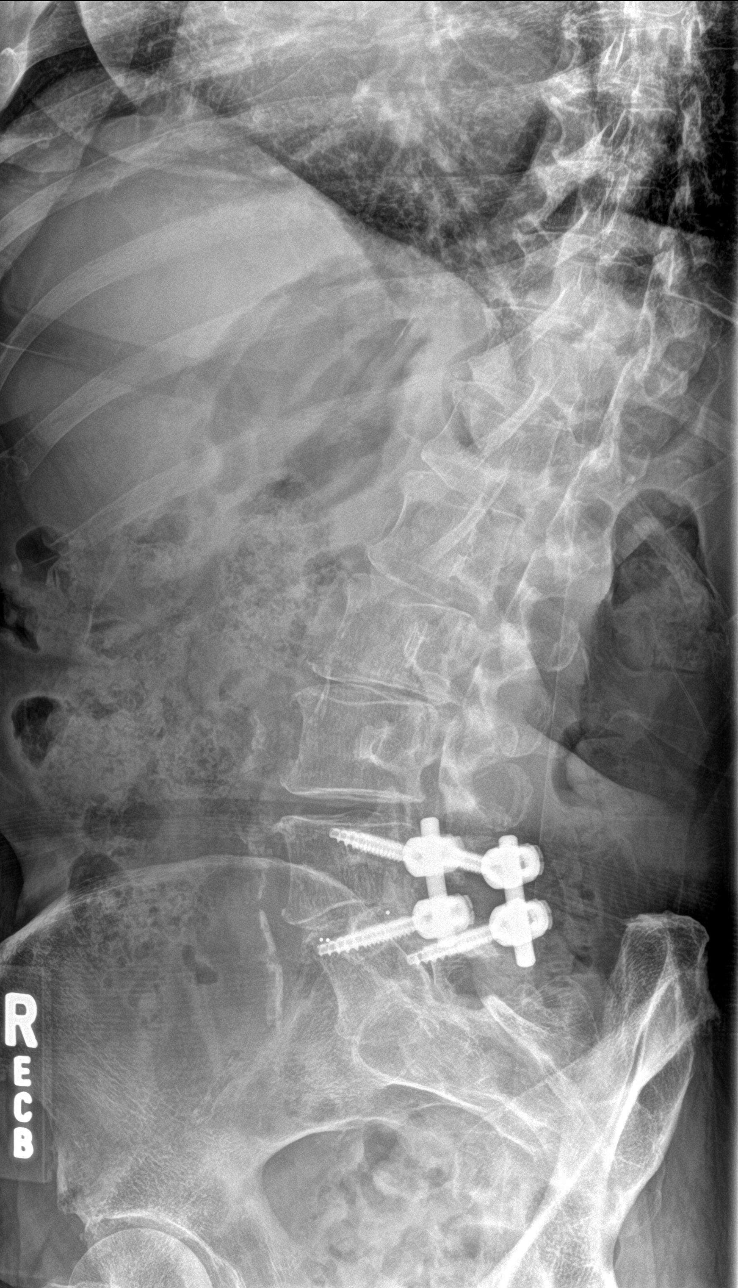

[l-spine obl (2 of 2)]
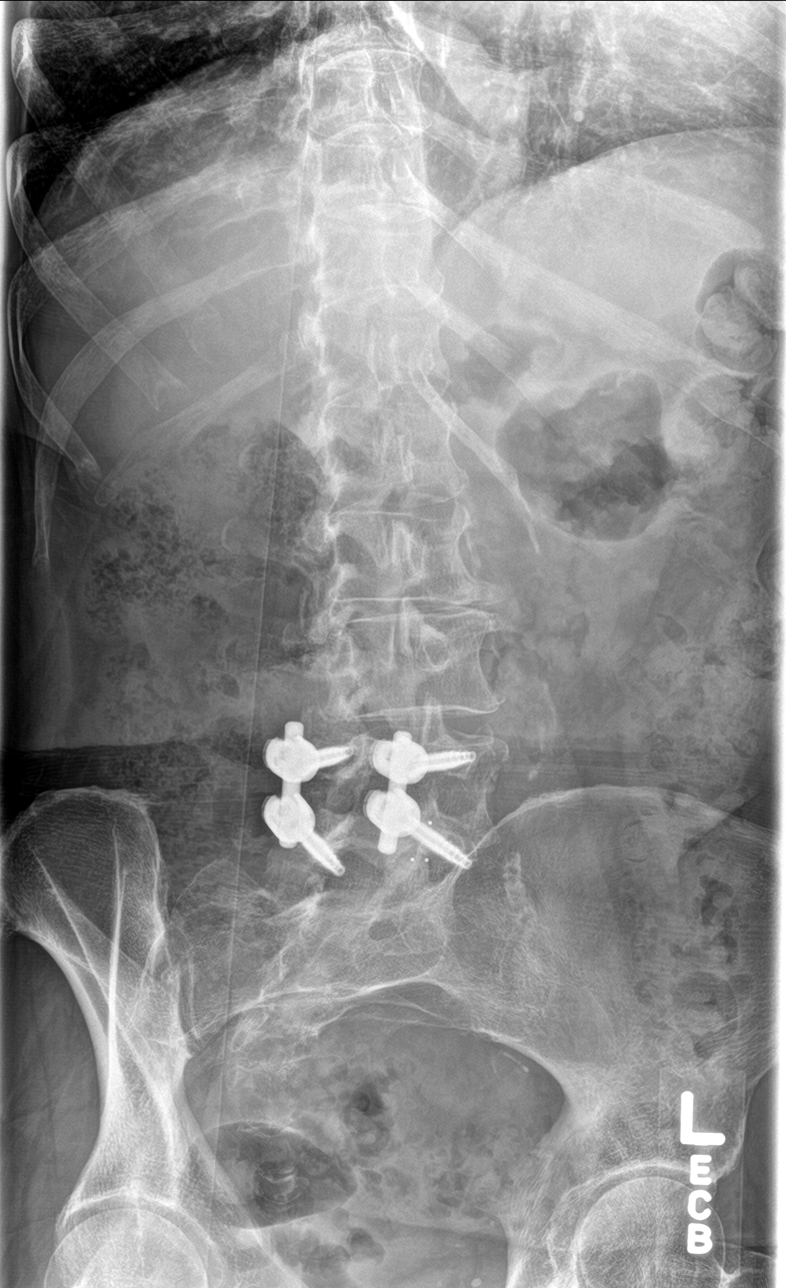

[l-spine lat]
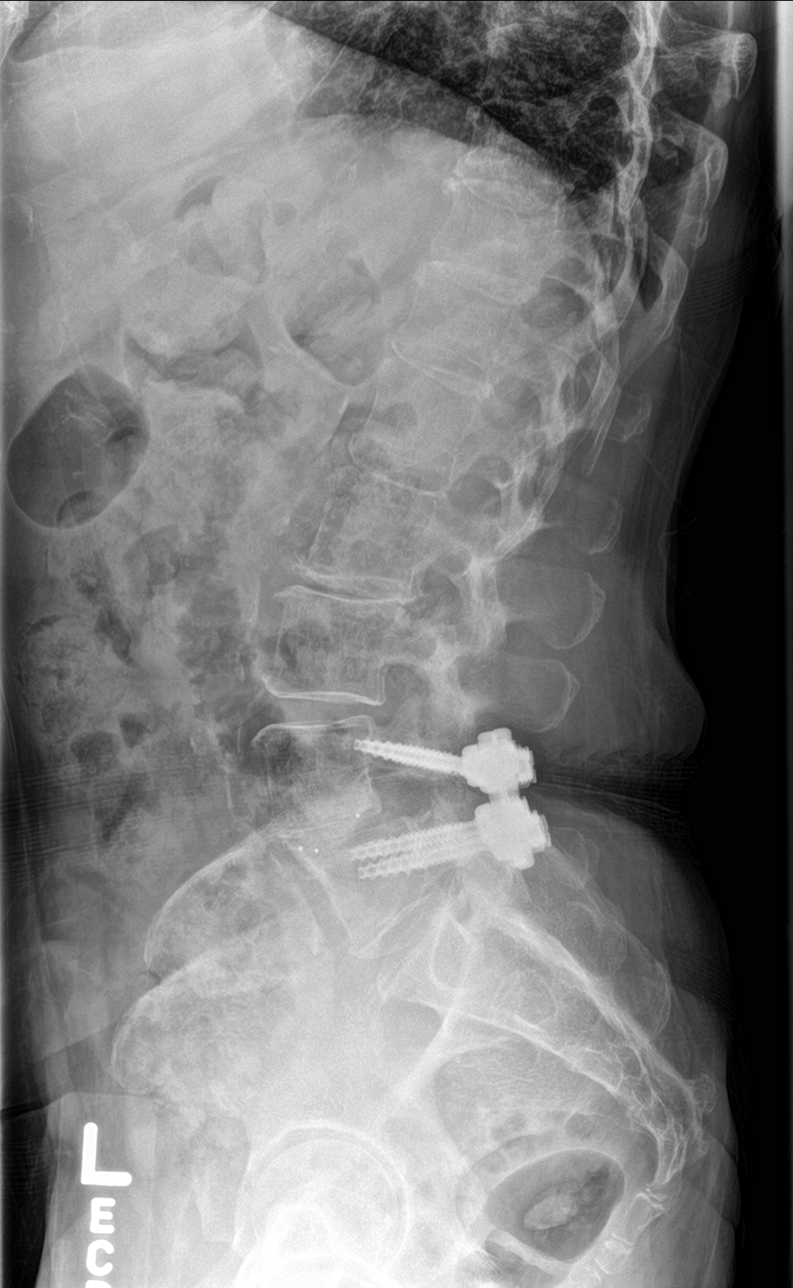

[l-spine spot]
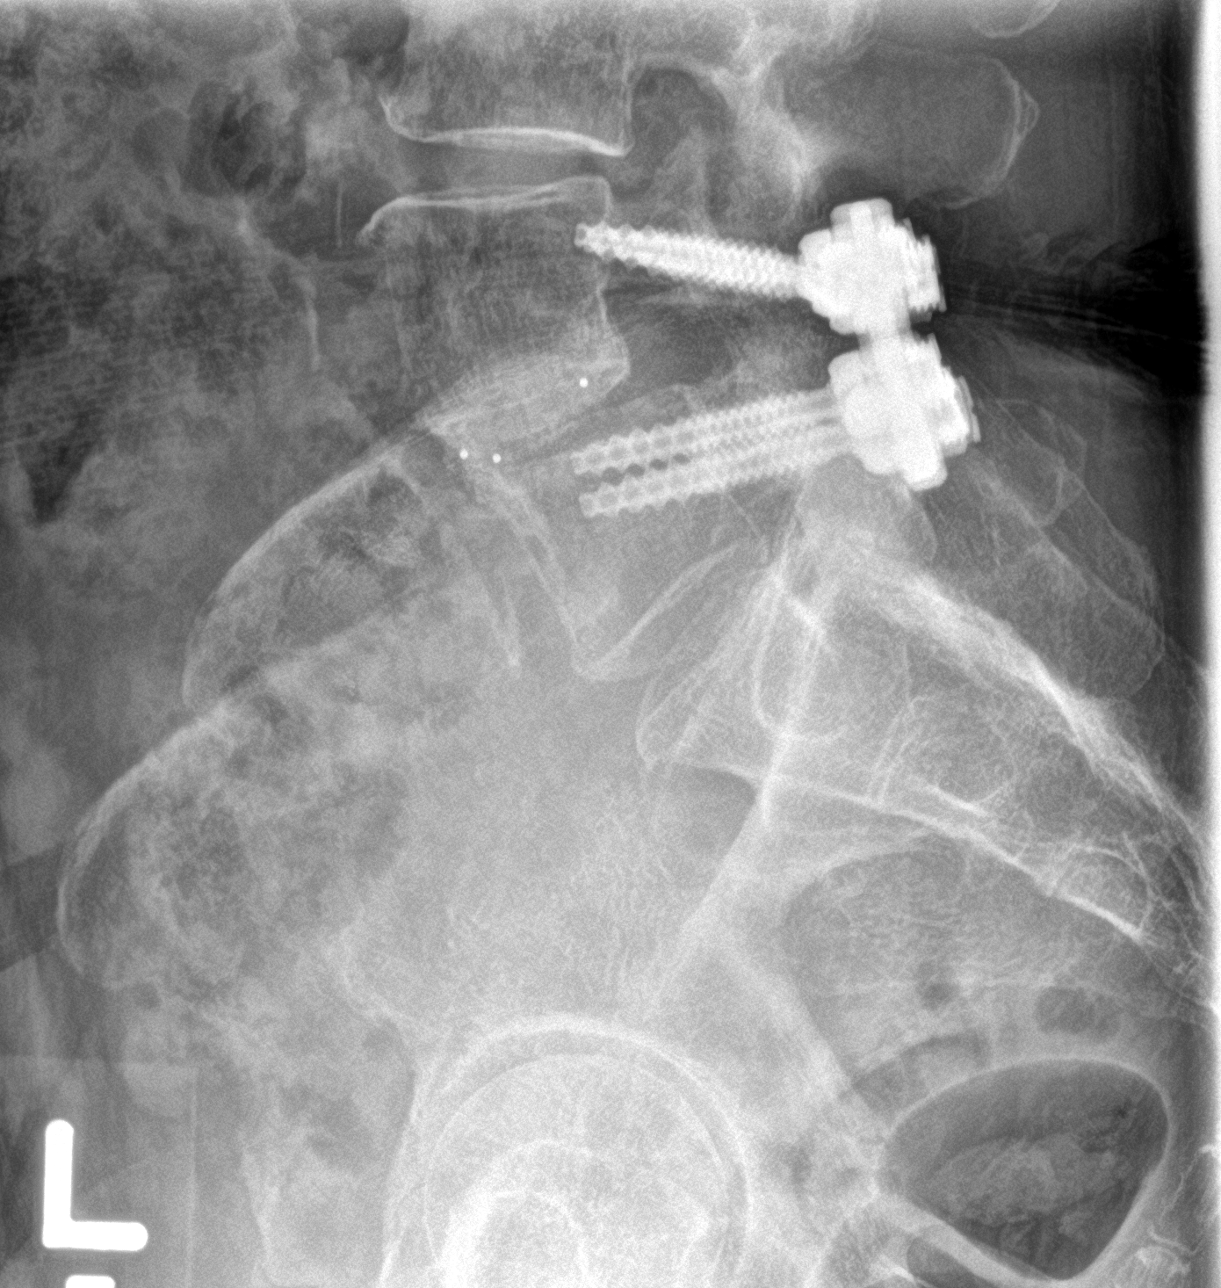

[5 of 5 positions shown; findings below may reference images not displayed]

FINDINGS: Pedicle screws with vertical interconnecting hardware at L4-5,
intact and unchanged from 03/19/2017. Intervertebral cage device at
L4-5, unchanged. No evidence of hardware failure. The lumbar
vertebrae are normal in height. Mild anterolisthesis of L4,
unchanged. Moderate degenerative disc and facet changes. Mild right
convex curvature, unchanged from 02/27/2016.

No evidence of acute lumbar spine fracture. Sacroiliac joints appear
intact.
IMPRESSION: Hardware is intact and unchanged. No evidence of acute lumbar spine
fracture. Unchanged curvature, degenerative changes and
spondylolisthesis.

## 2018-05-20 DIAGNOSIS — M48061 Spinal stenosis, lumbar region without neurogenic claudication: Secondary | ICD-10-CM | POA: Diagnosis not present

## 2018-05-20 DIAGNOSIS — E7800 Pure hypercholesterolemia, unspecified: Secondary | ICD-10-CM | POA: Diagnosis not present

## 2018-05-20 DIAGNOSIS — F329 Major depressive disorder, single episode, unspecified: Secondary | ICD-10-CM | POA: Diagnosis not present

## 2018-05-20 DIAGNOSIS — Z87891 Personal history of nicotine dependence: Secondary | ICD-10-CM | POA: Diagnosis not present

## 2018-05-20 DIAGNOSIS — K589 Irritable bowel syndrome without diarrhea: Secondary | ICD-10-CM | POA: Diagnosis not present

## 2018-05-20 DIAGNOSIS — K219 Gastro-esophageal reflux disease without esophagitis: Secondary | ICD-10-CM | POA: Diagnosis not present

## 2018-05-20 DIAGNOSIS — J449 Chronic obstructive pulmonary disease, unspecified: Secondary | ICD-10-CM | POA: Diagnosis not present

## 2018-05-20 DIAGNOSIS — Z6821 Body mass index (BMI) 21.0-21.9, adult: Secondary | ICD-10-CM | POA: Diagnosis not present

## 2018-09-03 DIAGNOSIS — J019 Acute sinusitis, unspecified: Secondary | ICD-10-CM | POA: Diagnosis not present

## 2018-09-03 DIAGNOSIS — J441 Chronic obstructive pulmonary disease with (acute) exacerbation: Secondary | ICD-10-CM | POA: Diagnosis not present

## 2018-09-03 DIAGNOSIS — Z6821 Body mass index (BMI) 21.0-21.9, adult: Secondary | ICD-10-CM | POA: Diagnosis not present

## 2018-11-23 DIAGNOSIS — K219 Gastro-esophageal reflux disease without esophagitis: Secondary | ICD-10-CM | POA: Diagnosis not present

## 2018-11-23 DIAGNOSIS — K589 Irritable bowel syndrome without diarrhea: Secondary | ICD-10-CM | POA: Diagnosis not present

## 2018-11-23 DIAGNOSIS — M48061 Spinal stenosis, lumbar region without neurogenic claudication: Secondary | ICD-10-CM | POA: Diagnosis not present

## 2018-11-23 DIAGNOSIS — J449 Chronic obstructive pulmonary disease, unspecified: Secondary | ICD-10-CM | POA: Diagnosis not present

## 2018-11-23 DIAGNOSIS — L309 Dermatitis, unspecified: Secondary | ICD-10-CM | POA: Diagnosis not present

## 2018-11-23 DIAGNOSIS — Z87891 Personal history of nicotine dependence: Secondary | ICD-10-CM | POA: Diagnosis not present

## 2018-11-23 DIAGNOSIS — F329 Major depressive disorder, single episode, unspecified: Secondary | ICD-10-CM | POA: Diagnosis not present

## 2018-11-23 DIAGNOSIS — E7800 Pure hypercholesterolemia, unspecified: Secondary | ICD-10-CM | POA: Diagnosis not present

## 2019-01-21 DIAGNOSIS — K219 Gastro-esophageal reflux disease without esophagitis: Secondary | ICD-10-CM | POA: Diagnosis not present

## 2019-01-21 DIAGNOSIS — J449 Chronic obstructive pulmonary disease, unspecified: Secondary | ICD-10-CM | POA: Diagnosis not present

## 2019-01-21 DIAGNOSIS — M48061 Spinal stenosis, lumbar region without neurogenic claudication: Secondary | ICD-10-CM | POA: Diagnosis not present

## 2019-01-21 DIAGNOSIS — F329 Major depressive disorder, single episode, unspecified: Secondary | ICD-10-CM | POA: Diagnosis not present

## 2019-01-21 DIAGNOSIS — E7800 Pure hypercholesterolemia, unspecified: Secondary | ICD-10-CM | POA: Diagnosis not present

## 2019-01-21 DIAGNOSIS — K589 Irritable bowel syndrome without diarrhea: Secondary | ICD-10-CM | POA: Diagnosis not present

## 2019-04-22 DIAGNOSIS — Z6821 Body mass index (BMI) 21.0-21.9, adult: Secondary | ICD-10-CM | POA: Diagnosis not present

## 2019-04-22 DIAGNOSIS — F329 Major depressive disorder, single episode, unspecified: Secondary | ICD-10-CM | POA: Diagnosis not present

## 2019-04-22 DIAGNOSIS — M48061 Spinal stenosis, lumbar region without neurogenic claudication: Secondary | ICD-10-CM | POA: Diagnosis not present

## 2019-04-22 DIAGNOSIS — K589 Irritable bowel syndrome without diarrhea: Secondary | ICD-10-CM | POA: Diagnosis not present

## 2019-04-22 DIAGNOSIS — J449 Chronic obstructive pulmonary disease, unspecified: Secondary | ICD-10-CM | POA: Diagnosis not present

## 2019-04-22 DIAGNOSIS — E7800 Pure hypercholesterolemia, unspecified: Secondary | ICD-10-CM | POA: Diagnosis not present

## 2019-04-22 DIAGNOSIS — K219 Gastro-esophageal reflux disease without esophagitis: Secondary | ICD-10-CM | POA: Diagnosis not present

## 2019-05-02 DIAGNOSIS — Z1331 Encounter for screening for depression: Secondary | ICD-10-CM | POA: Diagnosis not present

## 2019-05-02 DIAGNOSIS — Z9181 History of falling: Secondary | ICD-10-CM | POA: Diagnosis not present

## 2019-05-02 DIAGNOSIS — Z1231 Encounter for screening mammogram for malignant neoplasm of breast: Secondary | ICD-10-CM | POA: Diagnosis not present

## 2019-05-02 DIAGNOSIS — E785 Hyperlipidemia, unspecified: Secondary | ICD-10-CM | POA: Diagnosis not present

## 2019-05-02 DIAGNOSIS — Z0000 Encounter for general adult medical examination without abnormal findings: Secondary | ICD-10-CM | POA: Diagnosis not present

## 2019-06-24 DIAGNOSIS — J069 Acute upper respiratory infection, unspecified: Secondary | ICD-10-CM | POA: Diagnosis not present

## 2019-06-24 DIAGNOSIS — Z20828 Contact with and (suspected) exposure to other viral communicable diseases: Secondary | ICD-10-CM | POA: Diagnosis not present

## 2019-07-28 DIAGNOSIS — Z139 Encounter for screening, unspecified: Secondary | ICD-10-CM | POA: Diagnosis not present

## 2019-07-28 DIAGNOSIS — N289 Disorder of kidney and ureter, unspecified: Secondary | ICD-10-CM | POA: Diagnosis not present

## 2019-07-28 DIAGNOSIS — E7800 Pure hypercholesterolemia, unspecified: Secondary | ICD-10-CM | POA: Diagnosis not present

## 2019-07-28 DIAGNOSIS — J449 Chronic obstructive pulmonary disease, unspecified: Secondary | ICD-10-CM | POA: Diagnosis not present

## 2019-07-28 DIAGNOSIS — K219 Gastro-esophageal reflux disease without esophagitis: Secondary | ICD-10-CM | POA: Diagnosis not present

## 2019-07-28 DIAGNOSIS — K589 Irritable bowel syndrome without diarrhea: Secondary | ICD-10-CM | POA: Diagnosis not present

## 2019-07-28 DIAGNOSIS — F329 Major depressive disorder, single episode, unspecified: Secondary | ICD-10-CM | POA: Diagnosis not present

## 2019-07-28 DIAGNOSIS — Z6822 Body mass index (BMI) 22.0-22.9, adult: Secondary | ICD-10-CM | POA: Diagnosis not present

## 2019-07-28 DIAGNOSIS — Z1231 Encounter for screening mammogram for malignant neoplasm of breast: Secondary | ICD-10-CM | POA: Diagnosis not present

## 2019-07-28 DIAGNOSIS — M48061 Spinal stenosis, lumbar region without neurogenic claudication: Secondary | ICD-10-CM | POA: Diagnosis not present

## 2019-07-28 DIAGNOSIS — Z113 Encounter for screening for infections with a predominantly sexual mode of transmission: Secondary | ICD-10-CM | POA: Diagnosis not present

## 2020-02-08 ENCOUNTER — Other Ambulatory Visit: Payer: Self-pay | Admitting: Physician Assistant

## 2020-02-08 DIAGNOSIS — Z6821 Body mass index (BMI) 21.0-21.9, adult: Secondary | ICD-10-CM | POA: Diagnosis not present

## 2020-02-08 DIAGNOSIS — Z1231 Encounter for screening mammogram for malignant neoplasm of breast: Secondary | ICD-10-CM

## 2020-02-08 DIAGNOSIS — K589 Irritable bowel syndrome without diarrhea: Secondary | ICD-10-CM | POA: Diagnosis not present

## 2020-02-08 DIAGNOSIS — Z87891 Personal history of nicotine dependence: Secondary | ICD-10-CM | POA: Diagnosis not present

## 2020-02-08 DIAGNOSIS — M48061 Spinal stenosis, lumbar region without neurogenic claudication: Secondary | ICD-10-CM | POA: Diagnosis not present

## 2020-02-08 DIAGNOSIS — N183 Chronic kidney disease, stage 3 unspecified: Secondary | ICD-10-CM | POA: Diagnosis not present

## 2020-02-08 DIAGNOSIS — J449 Chronic obstructive pulmonary disease, unspecified: Secondary | ICD-10-CM | POA: Diagnosis not present

## 2020-02-08 DIAGNOSIS — F329 Major depressive disorder, single episode, unspecified: Secondary | ICD-10-CM | POA: Diagnosis not present

## 2020-02-08 DIAGNOSIS — K219 Gastro-esophageal reflux disease without esophagitis: Secondary | ICD-10-CM | POA: Diagnosis not present

## 2020-02-08 DIAGNOSIS — E7800 Pure hypercholesterolemia, unspecified: Secondary | ICD-10-CM | POA: Diagnosis not present

## 2020-03-14 DIAGNOSIS — F3341 Major depressive disorder, recurrent, in partial remission: Secondary | ICD-10-CM | POA: Diagnosis not present

## 2020-03-14 DIAGNOSIS — M48061 Spinal stenosis, lumbar region without neurogenic claudication: Secondary | ICD-10-CM | POA: Diagnosis not present

## 2020-03-14 DIAGNOSIS — Z6821 Body mass index (BMI) 21.0-21.9, adult: Secondary | ICD-10-CM | POA: Diagnosis not present

## 2020-03-14 DIAGNOSIS — Z1231 Encounter for screening mammogram for malignant neoplasm of breast: Secondary | ICD-10-CM | POA: Diagnosis not present

## 2020-04-04 DIAGNOSIS — Z1231 Encounter for screening mammogram for malignant neoplasm of breast: Secondary | ICD-10-CM | POA: Diagnosis not present

## 2020-04-20 DIAGNOSIS — M5416 Radiculopathy, lumbar region: Secondary | ICD-10-CM | POA: Diagnosis not present

## 2020-04-20 DIAGNOSIS — M545 Low back pain: Secondary | ICD-10-CM | POA: Diagnosis not present

## 2020-05-03 DIAGNOSIS — M545 Low back pain, unspecified: Secondary | ICD-10-CM | POA: Diagnosis not present

## 2020-05-14 DIAGNOSIS — M5416 Radiculopathy, lumbar region: Secondary | ICD-10-CM | POA: Diagnosis not present

## 2020-05-18 DIAGNOSIS — M5416 Radiculopathy, lumbar region: Secondary | ICD-10-CM | POA: Diagnosis not present

## 2020-05-18 DIAGNOSIS — F17210 Nicotine dependence, cigarettes, uncomplicated: Secondary | ICD-10-CM | POA: Diagnosis not present

## 2020-06-20 DIAGNOSIS — M5416 Radiculopathy, lumbar region: Secondary | ICD-10-CM | POA: Diagnosis not present

## 2020-07-05 DIAGNOSIS — E785 Hyperlipidemia, unspecified: Secondary | ICD-10-CM | POA: Diagnosis not present

## 2020-07-05 DIAGNOSIS — Z0000 Encounter for general adult medical examination without abnormal findings: Secondary | ICD-10-CM | POA: Diagnosis not present

## 2020-07-05 DIAGNOSIS — Z9181 History of falling: Secondary | ICD-10-CM | POA: Diagnosis not present

## 2020-07-05 DIAGNOSIS — Z1331 Encounter for screening for depression: Secondary | ICD-10-CM | POA: Diagnosis not present

## 2020-07-13 DIAGNOSIS — M5416 Radiculopathy, lumbar region: Secondary | ICD-10-CM | POA: Diagnosis not present

## 2020-08-02 DIAGNOSIS — R197 Diarrhea, unspecified: Secondary | ICD-10-CM | POA: Diagnosis not present

## 2020-08-02 DIAGNOSIS — Z20822 Contact with and (suspected) exposure to covid-19: Secondary | ICD-10-CM | POA: Diagnosis not present

## 2020-10-10 DIAGNOSIS — M5416 Radiculopathy, lumbar region: Secondary | ICD-10-CM | POA: Diagnosis not present

## 2020-11-05 DIAGNOSIS — M5416 Radiculopathy, lumbar region: Secondary | ICD-10-CM | POA: Diagnosis not present

## 2020-11-21 DIAGNOSIS — Z139 Encounter for screening, unspecified: Secondary | ICD-10-CM | POA: Diagnosis not present

## 2020-11-21 DIAGNOSIS — K219 Gastro-esophageal reflux disease without esophagitis: Secondary | ICD-10-CM | POA: Diagnosis not present

## 2020-11-21 DIAGNOSIS — K589 Irritable bowel syndrome without diarrhea: Secondary | ICD-10-CM | POA: Diagnosis not present

## 2020-11-21 DIAGNOSIS — E7800 Pure hypercholesterolemia, unspecified: Secondary | ICD-10-CM | POA: Diagnosis not present

## 2020-11-21 DIAGNOSIS — F17200 Nicotine dependence, unspecified, uncomplicated: Secondary | ICD-10-CM | POA: Diagnosis not present

## 2020-11-21 DIAGNOSIS — J449 Chronic obstructive pulmonary disease, unspecified: Secondary | ICD-10-CM | POA: Diagnosis not present

## 2020-11-21 DIAGNOSIS — F3341 Major depressive disorder, recurrent, in partial remission: Secondary | ICD-10-CM | POA: Diagnosis not present

## 2020-11-21 DIAGNOSIS — M48061 Spinal stenosis, lumbar region without neurogenic claudication: Secondary | ICD-10-CM | POA: Diagnosis not present

## 2020-11-21 DIAGNOSIS — Z6821 Body mass index (BMI) 21.0-21.9, adult: Secondary | ICD-10-CM | POA: Diagnosis not present

## 2020-11-21 DIAGNOSIS — N183 Chronic kidney disease, stage 3 unspecified: Secondary | ICD-10-CM | POA: Diagnosis not present

## 2020-11-28 DIAGNOSIS — M5416 Radiculopathy, lumbar region: Secondary | ICD-10-CM | POA: Diagnosis not present

## 2020-12-19 ENCOUNTER — Telehealth: Payer: Self-pay | Admitting: *Deleted

## 2020-12-19 NOTE — Telephone Encounter (Signed)
Received referral for low dose lung cancer screening CT scan. Message left at phone number listed in EMR for patient to call me back to facilitate scheduling scan.  

## 2020-12-28 DIAGNOSIS — M5416 Radiculopathy, lumbar region: Secondary | ICD-10-CM | POA: Diagnosis not present

## 2021-01-08 DIAGNOSIS — H00024 Hordeolum internum left upper eyelid: Secondary | ICD-10-CM | POA: Diagnosis not present

## 2021-01-14 DIAGNOSIS — H10013 Acute follicular conjunctivitis, bilateral: Secondary | ICD-10-CM | POA: Diagnosis not present

## 2021-01-21 DIAGNOSIS — Z6821 Body mass index (BMI) 21.0-21.9, adult: Secondary | ICD-10-CM | POA: Diagnosis not present

## 2021-01-21 DIAGNOSIS — J449 Chronic obstructive pulmonary disease, unspecified: Secondary | ICD-10-CM | POA: Diagnosis not present

## 2021-01-21 DIAGNOSIS — R011 Cardiac murmur, unspecified: Secondary | ICD-10-CM | POA: Diagnosis not present

## 2021-01-21 DIAGNOSIS — F3341 Major depressive disorder, recurrent, in partial remission: Secondary | ICD-10-CM | POA: Diagnosis not present

## 2021-01-21 DIAGNOSIS — E7800 Pure hypercholesterolemia, unspecified: Secondary | ICD-10-CM | POA: Diagnosis not present

## 2021-01-21 DIAGNOSIS — M48061 Spinal stenosis, lumbar region without neurogenic claudication: Secondary | ICD-10-CM | POA: Diagnosis not present

## 2021-01-21 DIAGNOSIS — K589 Irritable bowel syndrome without diarrhea: Secondary | ICD-10-CM | POA: Diagnosis not present

## 2021-01-21 DIAGNOSIS — F17200 Nicotine dependence, unspecified, uncomplicated: Secondary | ICD-10-CM | POA: Diagnosis not present

## 2021-01-21 DIAGNOSIS — K219 Gastro-esophageal reflux disease without esophagitis: Secondary | ICD-10-CM | POA: Diagnosis not present

## 2021-01-21 DIAGNOSIS — N183 Chronic kidney disease, stage 3 unspecified: Secondary | ICD-10-CM | POA: Diagnosis not present

## 2021-01-24 ENCOUNTER — Other Ambulatory Visit: Payer: Self-pay | Admitting: Physician Assistant

## 2021-01-24 DIAGNOSIS — R011 Cardiac murmur, unspecified: Secondary | ICD-10-CM

## 2021-02-15 ENCOUNTER — Other Ambulatory Visit: Payer: Self-pay

## 2021-02-15 ENCOUNTER — Ambulatory Visit
Admission: RE | Admit: 2021-02-15 | Discharge: 2021-02-15 | Disposition: A | Discharge status: Home / Self Care | Payer: PPO | Source: Ambulatory Visit | Attending: Physician Assistant | Admitting: Physician Assistant

## 2021-02-15 DIAGNOSIS — R011 Cardiac murmur, unspecified: Secondary | ICD-10-CM | POA: Diagnosis not present

## 2021-02-15 DIAGNOSIS — J449 Chronic obstructive pulmonary disease, unspecified: Secondary | ICD-10-CM | POA: Insufficient documentation

## 2021-02-15 LAB — ECHOCARDIOGRAM COMPLETE
AR max vel: 2.58 cm2
AV Area VTI: 3.07 cm2
AV Area mean vel: 2.84 cm2
AV Mean grad: 5 mmHg
AV Peak grad: 9.4 mmHg
Ao pk vel: 1.54 m/s
Area-P 1/2: 3.4 cm2
S' Lateral: 1.7 cm

## 2021-02-15 NOTE — Progress Notes (Signed)
*  PRELIMINARY RESULTS* Echocardiogram 2D Echocardiogram has been performed.  Stephanie Powers 02/15/2021, 12:04 PM

## 2021-05-15 DIAGNOSIS — Z1231 Encounter for screening mammogram for malignant neoplasm of breast: Secondary | ICD-10-CM | POA: Diagnosis not present

## 2021-06-17 DIAGNOSIS — J069 Acute upper respiratory infection, unspecified: Secondary | ICD-10-CM | POA: Diagnosis not present

## 2021-06-28 DIAGNOSIS — Z6821 Body mass index (BMI) 21.0-21.9, adult: Secondary | ICD-10-CM | POA: Diagnosis not present

## 2021-06-28 DIAGNOSIS — I959 Hypotension, unspecified: Secondary | ICD-10-CM | POA: Diagnosis not present

## 2021-06-28 DIAGNOSIS — J209 Acute bronchitis, unspecified: Secondary | ICD-10-CM | POA: Diagnosis not present

## 2021-06-28 DIAGNOSIS — J440 Chronic obstructive pulmonary disease with acute lower respiratory infection: Secondary | ICD-10-CM | POA: Diagnosis not present

## 2021-07-04 DIAGNOSIS — Z23 Encounter for immunization: Secondary | ICD-10-CM | POA: Diagnosis not present

## 2021-07-04 DIAGNOSIS — J440 Chronic obstructive pulmonary disease with acute lower respiratory infection: Secondary | ICD-10-CM | POA: Diagnosis not present

## 2021-07-04 DIAGNOSIS — Z6822 Body mass index (BMI) 22.0-22.9, adult: Secondary | ICD-10-CM | POA: Diagnosis not present

## 2021-07-04 DIAGNOSIS — J209 Acute bronchitis, unspecified: Secondary | ICD-10-CM | POA: Diagnosis not present

## 2021-07-04 DIAGNOSIS — M48061 Spinal stenosis, lumbar region without neurogenic claudication: Secondary | ICD-10-CM | POA: Diagnosis not present

## 2021-07-08 ENCOUNTER — Other Ambulatory Visit: Payer: Self-pay | Admitting: Physician Assistant

## 2021-07-08 DIAGNOSIS — Z1331 Encounter for screening for depression: Secondary | ICD-10-CM | POA: Diagnosis not present

## 2021-07-08 DIAGNOSIS — Z0000 Encounter for general adult medical examination without abnormal findings: Secondary | ICD-10-CM | POA: Diagnosis not present

## 2021-07-08 DIAGNOSIS — E2839 Other primary ovarian failure: Secondary | ICD-10-CM

## 2021-07-08 DIAGNOSIS — E785 Hyperlipidemia, unspecified: Secondary | ICD-10-CM | POA: Diagnosis not present

## 2021-07-08 DIAGNOSIS — Z9181 History of falling: Secondary | ICD-10-CM | POA: Diagnosis not present

## 2021-07-31 DIAGNOSIS — K589 Irritable bowel syndrome without diarrhea: Secondary | ICD-10-CM | POA: Diagnosis not present

## 2021-07-31 DIAGNOSIS — M48061 Spinal stenosis, lumbar region without neurogenic claudication: Secondary | ICD-10-CM | POA: Diagnosis not present

## 2021-07-31 DIAGNOSIS — J449 Chronic obstructive pulmonary disease, unspecified: Secondary | ICD-10-CM | POA: Diagnosis not present

## 2021-07-31 DIAGNOSIS — Z6822 Body mass index (BMI) 22.0-22.9, adult: Secondary | ICD-10-CM | POA: Diagnosis not present

## 2021-07-31 DIAGNOSIS — E7800 Pure hypercholesterolemia, unspecified: Secondary | ICD-10-CM | POA: Diagnosis not present

## 2021-07-31 DIAGNOSIS — F3341 Major depressive disorder, recurrent, in partial remission: Secondary | ICD-10-CM | POA: Diagnosis not present

## 2021-07-31 DIAGNOSIS — K219 Gastro-esophageal reflux disease without esophagitis: Secondary | ICD-10-CM | POA: Diagnosis not present

## 2021-09-23 DIAGNOSIS — M5416 Radiculopathy, lumbar region: Secondary | ICD-10-CM | POA: Diagnosis not present

## 2021-09-24 ENCOUNTER — Other Ambulatory Visit: Payer: Self-pay | Admitting: Gastroenterology

## 2021-09-24 DIAGNOSIS — R1084 Generalized abdominal pain: Secondary | ICD-10-CM | POA: Diagnosis not present

## 2021-09-24 DIAGNOSIS — K580 Irritable bowel syndrome with diarrhea: Secondary | ICD-10-CM | POA: Diagnosis not present

## 2021-09-24 DIAGNOSIS — K2100 Gastro-esophageal reflux disease with esophagitis, without bleeding: Secondary | ICD-10-CM | POA: Diagnosis not present

## 2021-09-24 DIAGNOSIS — Z86010 Personal history of colonic polyps: Secondary | ICD-10-CM | POA: Diagnosis not present

## 2021-10-09 DIAGNOSIS — M5416 Radiculopathy, lumbar region: Secondary | ICD-10-CM | POA: Diagnosis not present

## 2021-10-11 ENCOUNTER — Other Ambulatory Visit: Payer: Self-pay

## 2021-10-11 ENCOUNTER — Ambulatory Visit
Admission: RE | Admit: 2021-10-11 | Discharge: 2021-10-11 | Disposition: A | Discharge status: Home / Self Care | Payer: PPO | Source: Ambulatory Visit | Attending: Gastroenterology | Admitting: Gastroenterology

## 2021-10-11 DIAGNOSIS — R1084 Generalized abdominal pain: Secondary | ICD-10-CM | POA: Diagnosis not present

## 2021-10-11 DIAGNOSIS — R1110 Vomiting, unspecified: Secondary | ICD-10-CM | POA: Diagnosis not present

## 2021-10-11 DIAGNOSIS — R109 Unspecified abdominal pain: Secondary | ICD-10-CM | POA: Diagnosis not present

## 2021-10-11 DIAGNOSIS — I700 Atherosclerosis of aorta: Secondary | ICD-10-CM | POA: Diagnosis not present

## 2021-10-11 MED ORDER — IOHEXOL 300 MG/ML  SOLN
100.0000 mL | Freq: Once | INTRAMUSCULAR | Status: AC | PRN
  Administered 2021-10-11: 100 mL via INTRAVENOUS

## 2021-11-04 DIAGNOSIS — J441 Chronic obstructive pulmonary disease with (acute) exacerbation: Secondary | ICD-10-CM | POA: Diagnosis not present

## 2021-11-04 DIAGNOSIS — J019 Acute sinusitis, unspecified: Secondary | ICD-10-CM | POA: Diagnosis not present

## 2021-11-12 DIAGNOSIS — B9689 Other specified bacterial agents as the cause of diseases classified elsewhere: Secondary | ICD-10-CM | POA: Diagnosis not present

## 2021-11-12 DIAGNOSIS — H02005 Unspecified entropion of left lower eyelid: Secondary | ICD-10-CM | POA: Diagnosis not present

## 2021-11-12 DIAGNOSIS — J069 Acute upper respiratory infection, unspecified: Secondary | ICD-10-CM | POA: Diagnosis not present

## 2021-11-12 DIAGNOSIS — M48061 Spinal stenosis, lumbar region without neurogenic claudication: Secondary | ICD-10-CM | POA: Diagnosis not present

## 2021-11-12 DIAGNOSIS — J019 Acute sinusitis, unspecified: Secondary | ICD-10-CM | POA: Diagnosis not present

## 2021-12-10 ENCOUNTER — Encounter (INDEPENDENT_AMBULATORY_CARE_PROVIDER_SITE_OTHER): Payer: Self-pay

## 2021-12-10 DIAGNOSIS — H35371 Puckering of macula, right eye: Secondary | ICD-10-CM | POA: Diagnosis not present

## 2021-12-10 DIAGNOSIS — H3581 Retinal edema: Secondary | ICD-10-CM | POA: Diagnosis not present

## 2021-12-10 DIAGNOSIS — H25812 Combined forms of age-related cataract, left eye: Secondary | ICD-10-CM | POA: Diagnosis not present

## 2021-12-12 ENCOUNTER — Ambulatory Visit (INDEPENDENT_AMBULATORY_CARE_PROVIDER_SITE_OTHER): Payer: PPO | Admitting: Ophthalmology

## 2021-12-12 DIAGNOSIS — H2512 Age-related nuclear cataract, left eye: Secondary | ICD-10-CM

## 2021-12-12 DIAGNOSIS — H353112 Nonexudative age-related macular degeneration, right eye, intermediate dry stage: Secondary | ICD-10-CM | POA: Diagnosis not present

## 2021-12-12 DIAGNOSIS — H35371 Puckering of macula, right eye: Secondary | ICD-10-CM | POA: Diagnosis not present

## 2021-12-12 DIAGNOSIS — H353121 Nonexudative age-related macular degeneration, left eye, early dry stage: Secondary | ICD-10-CM | POA: Diagnosis not present

## 2021-12-12 DIAGNOSIS — H02005 Unspecified entropion of left lower eyelid: Secondary | ICD-10-CM

## 2021-12-12 NOTE — Assessment & Plan Note (Signed)

## 2021-12-12 NOTE — Assessment & Plan Note (Signed)
Very dense cataract accounting for all of her visual symptoms in the left eye.  I reassured the patient she is an excellent candidate for visual acuity recovery via cataract surgery with lens implantation with Dr. Wyatt Portela.  Return for evaluation to see with him and may need attention to the lower lid entropion left eye prior to proceeding to cataract surgery so as to prevent secondary issues post CE IOL

## 2021-12-12 NOTE — Assessment & Plan Note (Signed)
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  Likely will require repair prior to proceeding to cataract surgery

## 2021-12-12 NOTE — Progress Notes (Signed)
12/12/2021     CHIEF COMPLAINT Patient presents for  Chief Complaint  Patient presents with   Retina Evaluation      HISTORY OF PRESENT ILLNESS: Stephanie Powers is a 71 y.o. female who presents to the clinic today for:   HPI     Retina Evaluation           Laterality: right eye   Associated Symptoms: Flashes, Floaters, Distortion and Pain.  Negative for Blind Spot, Redness, Photophobia, Glare, Trauma, Scalp Tenderness, Jaw Claudication, Shoulder/Hip pain, Fever, Weight Loss and Fatigue   Treatments tried: no treatments         Comments   NP- ME ERM ref by Stephanie Powers. Pt stated, " I wanted to be seen for my left eye because its giving me problems but Dr. Katy Powers sent me here for my right eye. I had my cataract surgery on my right eye. They told me I couldn't get new glasses until I get my cataract surgery on my left eye. The left eye is sensitive to light. There is stabbing pain in my left eye." Pt reports seeinf FOL every now and then and one small floater.       Last edited by Stephanie Powers on 12/12/2021  8:31 AM.      Referring physician: Debbra Riding, MD [address] [place],  [place] [address]  HISTORICAL INFORMATION:   Selected notes from the MEDICAL RECORD NUMBER       CURRENT MEDICATIONS: Current Outpatient Medications (Ophthalmic Drugs)  Medication Sig   Polyvinyl Alcohol-Povidone (REFRESH OP) Place 1 drop into both eyes every 4 (four) hours as needed (dry eyes).   No current facility-administered medications for this visit. (Ophthalmic Drugs)   Current Outpatient Medications (Other)  Medication Sig   albuterol (PROVENTIL HFA;VENTOLIN HFA) 108 (90 BASE) MCG/ACT inhaler Inhale 1-2 puffs into the lungs every 6 (six) hours as needed for wheezing or shortness of breath.   atorvastatin (LIPITOR) 40 MG tablet Take 40 mg by mouth every morning.   cetirizine (ZYRTEC) 10 MG tablet Take 20 mg by mouth daily.    dicyclomine (BENTYL) 10 MG capsule Take  20 mg by mouth 2 (two) times daily before a meal.   HYDROcodone-acetaminophen (NORCO/VICODIN) 5-325 MG tablet Take 1 tablet by mouth every 4 (four) hours as needed.   Liniments (SALONPAS ARTHRITIS PAIN RELIEF) PADS Apply 1 each topically daily as needed (pain).    pantoprazole (PROTONIX) 40 MG tablet Take 40 mg by mouth 2 (two) times daily with a meal.   sertraline (ZOLOFT) 100 MG tablet Take 150 mg by mouth daily.    traZODone (DESYREL) 100 MG tablet Take 100 mg by mouth at bedtime.   triamcinolone cream (KENALOG) 0.1 % Apply 1 application topically at bedtime as needed (rash).    No current facility-administered medications for this visit. (Other)      REVIEW OF SYSTEMS: ROS   Negative for: Constitutional, Gastrointestinal, Neurological, Skin, Genitourinary, Musculoskeletal, HENT, Endocrine, Cardiovascular, Eyes, Respiratory, Psychiatric, Allergic/Imm, Heme/Lymph Last edited by Stephanie Powers on 12/12/2021  8:31 AM.       ALLERGIES Allergies  Allergen Reactions   Other Other (See Comments)    Allergic to makeup- rash-  Perfume, Hairspray- breathing difficulty and swelling    PAST MEDICAL HISTORY Past Medical History:  Diagnosis Date   Arthritis    "hands, shoulders, feet, back" (04/10/2015)   Chronic lower back pain    COPD (chronic obstructive pulmonary disease) ([place])  Depression    Dysrhythmia    "nothing to worry about" per MD   Family history of adverse reaction to anesthesia    Mother had stroke as she came out of anesthesia for back surgery.  (Blood pressure was high   Frequent sinus infections    GERD (gastroesophageal reflux disease)    Heart murmur    Hypercholesterolemia    Migraine    "once/month now" (04/10/2015)   Rash    "anywhere it wants to" chronic   Rash of neck    chronic   Shortness of breath dyspnea    with exertion   Spinal stenosis of lumbar region    Past Surgical History:  Procedure Laterality Date   25 GAUGE PARS PLANA VITRECTOMY WITH 20  GAUGE MVR PORT Right 04/10/2015   25 GAUGE PARS PLANA VITRECTOMY WITH 20 GAUGE MVR PORT Right 04/10/2015   Procedure: 25 GAUGE PARS PLANA VITRECTOMY WITH 20 GAUGE MVR PORT;  Surgeon: Stephanie Pedro, MD;  Location: [place];  Service: Ophthalmology;  Laterality: Right;   ABDOMINAL HYSTERECTOMY     "still have my left ovary"   CARPAL TUNNEL RELEASE Bilateral    COLONOSCOPY WITH PROPOFOL N/A 07/06/2015   Procedure: COLONOSCOPY WITH PROPOFOL;  Surgeon: Stephanie Sails, MD;  Location: [facility] ENDOSCOPY;  Service: Endoscopy;  Laterality: N/A;   ESOPHAGOGASTRODUODENOSCOPY (EGD) WITH PROPOFOL N/A 07/06/2015   Procedure: ESOPHAGOGASTRODUODENOSCOPY (EGD) WITH PROPOFOL;  Surgeon: Stephanie Sails, MD;  Location: [facility] ENDOSCOPY;  Service: Endoscopy;  Laterality: N/A;   ESOPHAGOGASTRODUODENOSCOPY (EGD) WITH PROPOFOL N/A 03/17/2017   Procedure: ESOPHAGOGASTRODUODENOSCOPY (EGD) WITH PROPOFOL;  Surgeon: Stephanie Sails, MD;  Location: [facility] ENDOSCOPY;  Service: Endoscopy;  Laterality: N/A;   EYE SURGERY     FRACTURE SURGERY     GAS/FLUID EXCHANGE Right 04/10/2015   Procedure: GAS/FLUID EXCHANGE;  Surgeon: Stephanie Pedro, MD;  Location: [place];  Service: Ophthalmology;  Laterality: Right;   LASER PHOTO ABLATION Right 04/10/2015   Procedure: LASER PHOTO ABLATION;  Surgeon: Stephanie Pedro, MD;  Location: [place];  Service: Ophthalmology;  Laterality: Right;  Head scope laser   MEMBRANE PEEL Right 04/10/2015   Procedure: MEMBRANE PEEL;  Surgeon: Stephanie Pedro, MD;  Location: [place];  Service: Ophthalmology;  Laterality: Right;   NASAL SINUS SURGERY     WRIST FRACTURE SURGERY Right     FAMILY HISTORY Family History  Problem Relation Age of Onset   Stroke Mother    Cancer - Lung Father     SOCIAL HISTORY Social History   Tobacco Use   Smoking status: Every Day    Packs/day: 1.00    Years: 40.00    Pack years: 40.00    Types: Cigarettes   Smokeless tobacco: Never  Substance Use Topics   Alcohol use: No    Drug use: No         OPHTHALMIC EXAM:  Base Eye Exam     Visual Acuity (ETDRS)       Right Left   Dist [place] 20/60 20/25 -2 +2   Dist ph  NI          Tonometry (Tonopen, 8:38 AM)       Right Left   Pressure 18 15         Pupils       Pupils Dark Light Shape React APD   Right PERRL 3 2 Round Brisk None   Left PERRL 3 2 Round Brisk None  Extraocular Movement       Right Left    Full Full         Neuro/Psych     Oriented x3: Yes         Dilation     Both eyes: 1.0% Mydriacyl, 2.5% Phenylephrine @ 8:38 AM           Slit Lamp and Fundus Exam     Slit Lamp Exam       Right Left   Lids/Lashes Normal Entropion, lower lid easily repositioned with pulling of the lower lid   Conjunctiva/Sclera White and quiet White and quiet   Cornea Clear Clear   Anterior Chamber Deep and quiet Deep and quiet   Iris Round and reactive Round and reactive   Lens Centered posterior chamber intraocular lens 3+ Nuclear sclerosis,, dense brunescent NS   Anterior Vitreous Normal Normal         Fundus Exam       Right Left   Posterior Vitreous Clear avitric Normal   Disc Normal Normal   C/D Ratio 0.4 0.4   Macula Severe topographic distortion macular region from white ERM Early age related macular degeneration, Hard drusen   Vessels Normal Normal   Periphery Anterior to equator very large chorioretinal laser applications. Normal            IMAGING AND PROCEDURES  Imaging and Procedures for 12/12/21  OCT, Retina - OU - Both Eyes       Right Eye Quality was good. Scan locations included subfoveal. Central Foveal Thickness: 531. Progression has no prior data. Findings include retinal drusen , cystoid macular edema, epiretinal membrane.   Left Eye Quality was good. Scan locations included subfoveal. Central Foveal Thickness: 286. Progression has no prior data. Findings include normal foveal contour, retinal drusen .   Notes Thickening  topographic distortion from ILM tortuosity triggering CME OD.  OS and OD with minor drusen of the retina no sign of CNVM either eye     Color Fundus Photography Optos - OU - Both Eyes       Right Eye Progression has no prior data. Disc findings include normal observations. Macula : drusen, epiretinal membrane. Vessels : normal observations. Periphery : normal observations.   Left Eye Progression has no prior data. Disc findings include normal observations. Macula : drusen. Vessels : normal observations. Periphery : normal observations.   Notes OD, with severe epiretinal membrane topographic distortion.  We will suggest with secondary CME vitrectomy membrane peel to allow for visual acuity improvement             ASSESSMENT/PLAN:  Intermediate stage nonexudative age-related macular degeneration of right eye The nature of age--related macular degeneration was discussed with the patient as well as the distinction between dry and wet types. Checking an Amsler Grid daily with advice to return immediately should a distortion develop, was given to the patient. The patient 's smoking status now and in the past was determined and advice based on the AREDS study was provided regarding the consumption of antioxidant supplements. AREDS 2 vitamin formulation was recommended. Consumption of dark leafy vegetables and fresh fruits of various colors was recommended. Treatment modalities for wet macular degeneration particularly the use of intravitreal injections of anti-blood vessel growth factors was discussed with the patient. Avastin, Lucentis, and Eylea are the available options. On occasion, therapy includes the use of photodynamic therapy and thermal laser. Stressed to the patient do not rub eyes.  Patient was advised to  check Amsler Grid daily and return immediately if changes are noted. Instructions on using the grid were given to the patient. All patient questions were answered.  Epiretinal  membrane, right eye The nature of macular pucker (epiretinal membrane ERM) was discussed with the patient as well as threshold criteria for vitrectomy surgery. I explained that in rare cases another surgery is needed to actually remove a second wrinkle should it regrow.  Most often, the epiretinal membrane and underlying wrinkled internal limiting membrane are removed with the first surgery, to accomplish the goals.   If the operative eye is Phakic (natural lens still present), cataract surgery is often recommended prior to Vitrectomy. This will enable the retina surgeon to have the best view during surgery and the patient to obtain optimal results in the future. Treatment options were discussed.  I have recommended at home monitoring the near vision task in a monocular (1 eye at a time), with or without near vision glasses, to look for changes or declines in reading.  OD clearly with epiretinal membrane in the form of ILM with secondary CME present in the macular foveal region.  Patient is post vitrectomy, presumably performed elsewhere, approximately 2016 by Dr. Zigmund Daniel?  OD by OCT and examination clearly there is ILM remnant with severe topographic distortion remaining.  OD I recommend vitrectomy membrane peel (no gas injection required) in order to recover visual acuity.  Explained patient that there will be no gas bubble required in this condition   Early stage nonexudative age-related macular degeneration of left eye No sign of CNVM OS excellent candidate to proceed for cataract surgery once the lower lid entropion is repaired  Entropion of left lower eyelid OS with tearing and significant lower lid entropion due to anterior lamellar overriding the lid margin.  Likely will require repair prior to proceeding to cataract surgery  Nuclear sclerotic cataract of left eye Very dense cataract accounting for all of her visual symptoms in the left eye.  I reassured the patient she is an excellent  candidate for visual acuity recovery via cataract surgery with lens implantation with Dr. Wyatt Powers.  Return for evaluation to see with him and may need attention to the lower lid entropion left eye prior to proceeding to cataract surgery so as to prevent secondary issues post CE IOL     ICD-10-CM   1. Epiretinal membrane, right eye  H35.371 OCT, Retina - OU - Both Eyes    Color Fundus Photography Optos - OU - Both Eyes    2. Intermediate stage nonexudative age-related macular degeneration of right eye  H35.3112 OCT, Retina - OU - Both Eyes    Color Fundus Photography Optos - OU - Both Eyes    3. Early stage nonexudative age-related macular degeneration of left eye  H35.3121 OCT, Retina - OU - Both Eyes    4. Entropion of left lower eyelid  H02.005     5. Nuclear sclerotic cataract of left eye  H25.12       1.  OD we will schedule for vitrectomy and membrane peel the right eye in order to allow for visual acuity recovery.  Patient understands that with her chronic lung disease at the healing process can take somewhat longer.  But nonetheless, visual acuity recovery is very likely over the ensuing 4 to 8 months.  OD. We will schedule vitrectomy membrane peel right eye in the near future under [facility] 2.  OS excellent candidate for cataract surgery with no complications or findings  posteriorly to limit visual acuity recovery.  3.  OS may need attention to lower lid entropion prior to cataract surgery follow-up Dr. Wyatt Powers  Ophthalmic Meds Ordered this visit:  No orders of the defined types were placed in this encounter.      Return [facility], for Schedule vitrectomy, ILM [id number], OD.  There are no Patient Instructions on file for this visit.   Explained the diagnoses, plan, and follow up with the patient and they expressed understanding.  Patient expressed understanding of the importance of proper follow up care.   Clent Demark Shalamar Crays M.D. Diseases & Surgery of  the Retina and Vitreous Retina & Diabetic [place] 12/12/21     Abbreviations: M myopia (nearsighted); A astigmatism; H hyperopia (farsighted); P presbyopia; Mrx spectacle prescription;  CTL contact lenses; OD right eye; OS left eye; OU both eyes  XT exotropia; ET esotropia; PEK punctate epithelial keratitis; PEE punctate epithelial erosions; DES dry eye syndrome; MGD meibomian gland dysfunction; ATs artificial tears; PFAT's preservative free artificial tears; [place] nuclear sclerotic cataract; PSC posterior subcapsular cataract; ERM epi-retinal membrane; PVD posterior vitreous detachment; RD retinal detachment; DM diabetes mellitus; DR diabetic retinopathy; NPDR non-proliferative diabetic retinopathy; PDR proliferative diabetic retinopathy; CSME clinically significant macular edema; DME diabetic macular edema; dbh dot blot hemorrhages; CWS cotton wool spot; POAG primary open angle glaucoma; C/D cup-to-disc ratio; HVF humphrey visual field; GVF goldmann visual field; OCT optical coherence tomography; IOP intraocular pressure; BRVO Branch retinal vein occlusion; CRVO central retinal vein occlusion; CRAO central retinal artery occlusion; BRAO branch retinal artery occlusion; RT retinal tear; SB scleral buckle; PPV pars plana vitrectomy; VH Vitreous hemorrhage; PRP panretinal laser photocoagulation; IVK intravitreal kenalog; VMT vitreomacular traction; MH Macular hole;  NVD neovascularization of the disc; NVE neovascularization elsewhere; AREDS age related eye disease study; ARMD age related macular degeneration; POAG primary open angle glaucoma; EBMD epithelial/anterior basement membrane dystrophy; ACIOL anterior chamber intraocular lens; IOL intraocular lens; PCIOL posterior chamber intraocular lens; Phaco/IOL phacoemulsification with intraocular lens placement; [place] photorefractive keratectomy; LASIK laser assisted in situ keratomileusis; HTN hypertension; DM diabetes mellitus; COPD chronic obstructive  pulmonary disease

## 2021-12-12 NOTE — Assessment & Plan Note (Signed)
The nature of macular pucker (epiretinal membrane ERM) was discussed with the patient as well as threshold criteria for vitrectomy surgery. I explained that in rare cases another surgery is needed to actually remove a second wrinkle should it regrow.  Most often, the epiretinal membrane and underlying wrinkled internal limiting membrane are removed with the first surgery, to accomplish the goals.   If the operative eye is Phakic (natural lens still present), cataract surgery is often recommended prior to Vitrectomy. This will enable the retina surgeon to have the best view during surgery and the patient to obtain optimal results in the future. Treatment options were discussed.  I have recommended at home monitoring the near vision task in a monocular (1 eye at a time), with or without near vision glasses, to look for changes or declines in reading.  OD clearly with epiretinal membrane in the form of ILM with secondary CME present in the macular foveal region.  Patient is post vitrectomy, presumably performed elsewhere, approximately 2016 by Dr. Zigmund Daniel?  OD by OCT and examination clearly there is ILM remnant with severe topographic distortion remaining.  OD I recommend vitrectomy membrane peel (no gas injection required) in order to recover visual acuity.  Explained patient that there will be no gas bubble required in this condition

## 2021-12-12 NOTE — Assessment & Plan Note (Signed)
No sign of CNVM OS excellent candidate to proceed for cataract surgery once the lower lid entropion is repaired

## 2021-12-16 ENCOUNTER — Ambulatory Visit: Admission: RE | Admit: 2021-12-16 | Payer: PPO | Source: Home / Self Care

## 2021-12-16 ENCOUNTER — Encounter: Admission: RE | Payer: Self-pay | Source: Home / Self Care

## 2021-12-16 SURGERY — COLONOSCOPY WITH PROPOFOL
Anesthesia: General

## 2022-01-01 DIAGNOSIS — S61551A Open bite of right wrist, initial encounter: Secondary | ICD-10-CM | POA: Diagnosis not present

## 2022-01-01 DIAGNOSIS — W5501XA Bitten by cat, initial encounter: Secondary | ICD-10-CM | POA: Diagnosis not present

## 2022-01-02 ENCOUNTER — Ambulatory Visit (INDEPENDENT_AMBULATORY_CARE_PROVIDER_SITE_OTHER): Payer: PPO

## 2022-01-02 ENCOUNTER — Encounter (INDEPENDENT_AMBULATORY_CARE_PROVIDER_SITE_OTHER): Payer: Self-pay

## 2022-01-02 MED ORDER — OFLOXACIN 0.3 % OP SOLN: 1.0000 [drp] | Freq: Four times a day (QID) | OPHTHALMIC | 0 refills | Status: AC

## 2022-01-02 MED ORDER — PREDNISOLONE ACETATE 1 % OP SUSP: 1.0000 [drp] | Freq: Four times a day (QID) | OPHTHALMIC | 0 refills | Status: AC

## 2022-01-02 NOTE — Progress Notes (Signed)
01/02/2022     CHIEF COMPLAINT Patient presents for Pre-op Exam   HISTORY OF PRESENT ILLNESS: Stephanie Powers is a 71 y.o. female who presents to the clinic today for:   HPI   Pre op OD sx 01/22/2022. Patient states vision is stable and unchanged since last visit. Denies any new floaters or FOL.  Last edited by Laurin Coder on 01/02/2022 11:08 AM.        HISTORICAL INFORMATION:   Selected notes from the MEDICAL RECORD NUMBER       CURRENT MEDICATIONS: Current Outpatient Medications (Ophthalmic Drugs)  Medication Sig   ofloxacin (OCUFLOX) 0.3 % ophthalmic solution Place 1 drop into the right eye in the morning, at noon, in the evening, and at bedtime for 21 doses.   prednisoLONE acetate (PRED FORTE) 1 % ophthalmic suspension Place 1 drop into the right eye 4 (four) times daily for 21 doses.   Polyvinyl Alcohol-Povidone (REFRESH OP) Place 1 drop into both eyes every 4 (four) hours as needed (dry eyes).   No current facility-administered medications for this visit. (Ophthalmic Drugs)   Current Outpatient Medications (Other)  Medication Sig   albuterol (PROVENTIL HFA;VENTOLIN HFA) 108 (90 BASE) MCG/ACT inhaler Inhale 1-2 puffs into the lungs every 6 (six) hours as needed for wheezing or shortness of breath.   atorvastatin (LIPITOR) 40 MG tablet Take 40 mg by mouth every morning.   cetirizine (ZYRTEC) 10 MG tablet Take 20 mg by mouth daily.    dicyclomine (BENTYL) 10 MG capsule Take 20 mg by mouth 2 (two) times daily before a meal.   HYDROcodone-acetaminophen (NORCO/VICODIN) 5-325 MG tablet Take 1 tablet by mouth every 4 (four) hours as needed.   Liniments (SALONPAS ARTHRITIS PAIN RELIEF) PADS Apply 1 each topically daily as needed (pain).    pantoprazole (PROTONIX) 40 MG tablet Take 40 mg by mouth 2 (two) times daily with a meal.   sertraline (ZOLOFT) 100 MG tablet Take 150 mg by mouth daily.    traZODone (DESYREL) 100 MG tablet Take 100 mg by mouth at bedtime.    triamcinolone cream (KENALOG) 0.1 % Apply 1 application topically at bedtime as needed (rash).    No current facility-administered medications for this visit. (Other)     ALLERGIES Allergies  Allergen Reactions   Other Other (See Comments)    Allergic to makeup- rash-  Perfume, Hairspray- breathing difficulty and swelling    PAST MEDICAL HISTORY Past Medical History:  Diagnosis Date   Arthritis    "hands, shoulders, feet, back" (04/10/2015)   Chronic lower back pain    COPD (chronic obstructive pulmonary disease) ([facility])    Depression    Dysrhythmia    "nothing to worry about" per MD   Family history of adverse reaction to anesthesia    Mother had stroke as she came out of anesthesia for back surgery.  (Blood pressure was high   Frequent sinus infections    GERD (gastroesophageal reflux disease)    Heart murmur    Hypercholesterolemia    Migraine    "once/month now" (04/10/2015)   Rash    "anywhere it wants to" chronic   Rash of neck    chronic   Shortness of breath dyspnea    with exertion   Spinal stenosis of lumbar region    Past Surgical History:  Procedure Laterality Date   25 GAUGE PARS PLANA VITRECTOMY WITH 20 GAUGE MVR PORT Right 04/10/2015   25 GAUGE PARS PLANA VITRECTOMY WITH 20  GAUGE MVR PORT Right 04/10/2015   Procedure: 25 GAUGE PARS PLANA VITRECTOMY WITH 20 GAUGE MVR PORT;  Surgeon: Hayden Pedro, MD;  Location: [place];  Service: Ophthalmology;  Laterality: Right;   ABDOMINAL HYSTERECTOMY     "still have my left ovary"   CARPAL TUNNEL RELEASE Bilateral    COLONOSCOPY WITH PROPOFOL N/A 07/06/2015   Procedure: COLONOSCOPY WITH PROPOFOL;  Surgeon: Lollie Sails, MD;  Location: [facility] ENDOSCOPY;  Service: Endoscopy;  Laterality: N/A;   ESOPHAGOGASTRODUODENOSCOPY (EGD) WITH PROPOFOL N/A 07/06/2015   Procedure: ESOPHAGOGASTRODUODENOSCOPY (EGD) WITH PROPOFOL;  Surgeon: Lollie Sails, MD;  Location: [facility] ENDOSCOPY;  Service: Endoscopy;  Laterality: N/A;    ESOPHAGOGASTRODUODENOSCOPY (EGD) WITH PROPOFOL N/A 03/17/2017   Procedure: ESOPHAGOGASTRODUODENOSCOPY (EGD) WITH PROPOFOL;  Surgeon: Lollie Sails, MD;  Location: [facility] ENDOSCOPY;  Service: Endoscopy;  Laterality: N/A;   EYE SURGERY     FRACTURE SURGERY     GAS/FLUID EXCHANGE Right 04/10/2015   Procedure: GAS/FLUID EXCHANGE;  Surgeon: Hayden Pedro, MD;  Location: [place];  Service: Ophthalmology;  Laterality: Right;   LASER PHOTO ABLATION Right 04/10/2015   Procedure: LASER PHOTO ABLATION;  Surgeon: Hayden Pedro, MD;  Location: [place];  Service: Ophthalmology;  Laterality: Right;  Head scope laser   MEMBRANE PEEL Right 04/10/2015   Procedure: MEMBRANE PEEL;  Surgeon: Hayden Pedro, MD;  Location: [place];  Service: Ophthalmology;  Laterality: Right;   NASAL SINUS SURGERY     WRIST FRACTURE SURGERY Right     FAMILY HISTORY Family History  Problem Relation Age of Onset   Stroke Mother    Cancer - Lung Father     SOCIAL HISTORY Social History   Tobacco Use   Smoking status: Every Day    Packs/day: 1.00    Years: 40.00    Total pack years: 40.00    Types: Cigarettes   Smokeless tobacco: Never  Substance Use Topics   Alcohol use: No   Drug use: No         OPHTHALMIC EXAM:  Base Eye Exam     Visual Acuity (ETDRS)       Right Left   Dist [place] 20/60 -2 20/25 -2   Dist ph [place] NI          Tonometry (Tonopen, 11:10 AM)       Right Left   Pressure 23          Pupils       Pupils Dark Light APD   Right PERRL 3 2 None   Left PERRL 3 2 None         Extraocular Movement       Right Left    Full Full         Neuro/Psych     Oriented x3: Yes   Mood/Affect: Normal         Dilation     Both eyes: No dilation @ 11:08 AM            IMAGING AND PROCEDURES  Imaging and Procedures for '@TODAY'$ @           ASSESSMENT/PLAN:  No diagnosis found.  Ophthalmic Meds Ordered this visit:  Meds ordered this encounter  Medications   prednisoLONE  acetate (PRED FORTE) 1 % ophthalmic suspension    Sig: Place 1 drop into the right eye 4 (four) times daily for 21 doses.    Dispense:  5 mL    Refill:  0   ofloxacin (  OCUFLOX) 0.3 % ophthalmic solution    Sig: Place 1 drop into the right eye in the morning, at noon, in the evening, and at bedtime for 21 doses.    Dispense:  5 mL    Refill:  0        Pre-op completed. Operative consent obtained with pre-op eye drops reviewed with Alta Corning and sent via [facility] as needed. Post op instructions reviewed with patient and per patient all questions answered.  Laurin Coder

## 2022-01-22 ENCOUNTER — Encounter (AMBULATORY_SURGERY_CENTER): Payer: PPO | Admitting: Ophthalmology

## 2022-01-22 DIAGNOSIS — H547 Unspecified visual loss: Secondary | ICD-10-CM | POA: Diagnosis not present

## 2022-01-22 DIAGNOSIS — H35371 Puckering of macula, right eye: Secondary | ICD-10-CM | POA: Diagnosis not present

## 2022-01-23 ENCOUNTER — Ambulatory Visit (INDEPENDENT_AMBULATORY_CARE_PROVIDER_SITE_OTHER): Payer: PPO | Admitting: Ophthalmology

## 2022-01-23 ENCOUNTER — Encounter (INDEPENDENT_AMBULATORY_CARE_PROVIDER_SITE_OTHER): Payer: PPO | Admitting: Ophthalmology

## 2022-01-23 DIAGNOSIS — H35371 Puckering of macula, right eye: Secondary | ICD-10-CM

## 2022-01-23 NOTE — Assessment & Plan Note (Signed)
Postop day #1 looks great much less topographic distortion.

## 2022-01-23 NOTE — Progress Notes (Signed)
01/23/2022     CHIEF COMPLAINT Patient presents for  Chief Complaint  Patient presents with   Post-op Follow-up      HISTORY OF PRESENT ILLNESS: Stephanie Powers is a 71 y.o. female who presents to the clinic today for:   HPI     Post-op Follow-up           Discomfort: pain.  Negative for itching, foreign body sensation, tearing, discharge and floaters   Vision: is stable and is blurred at distance         Comments   1 day post op OD SX 01/22/22 Pt stated vision is still blurry.  Pt is having pain in the right eye. Pt described pain as an ache and tenderness.       Last edited by Silvestre Moment on 01/23/2022  2:16 PM.      Referring physician: Cyndi Bender, PA-C [place],  [place] [address]  HISTORICAL INFORMATION:   Selected notes from the MEDICAL RECORD NUMBER       CURRENT MEDICATIONS: Current Outpatient Medications (Ophthalmic Drugs)  Medication Sig   Polyvinyl Alcohol-Povidone (REFRESH OP) Place 1 drop into both eyes every 4 (four) hours as needed (dry eyes).   No current facility-administered medications for this visit. (Ophthalmic Drugs)   Current Outpatient Medications (Other)  Medication Sig   albuterol (PROVENTIL HFA;VENTOLIN HFA) 108 (90 BASE) MCG/ACT inhaler Inhale 1-2 puffs into the lungs every 6 (six) hours as needed for wheezing or shortness of breath.   atorvastatin (LIPITOR) 40 MG tablet Take 40 mg by mouth every morning.   cetirizine (ZYRTEC) 10 MG tablet Take 20 mg by mouth daily.    dicyclomine (BENTYL) 10 MG capsule Take 20 mg by mouth 2 (two) times daily before a meal.   HYDROcodone-acetaminophen (NORCO/VICODIN) 5-325 MG tablet Take 1 tablet by mouth every 4 (four) hours as needed.   Liniments (SALONPAS ARTHRITIS PAIN RELIEF) PADS Apply 1 each topically daily as needed (pain).    pantoprazole (PROTONIX) 40 MG tablet Take 40 mg by mouth 2 (two) times daily with a meal.   sertraline (ZOLOFT) 100 MG tablet Take 150 mg by mouth  daily.    traZODone (DESYREL) 100 MG tablet Take 100 mg by mouth at bedtime.   triamcinolone cream (KENALOG) 0.1 % Apply 1 application topically at bedtime as needed (rash).    No current facility-administered medications for this visit. (Other)      REVIEW OF SYSTEMS: ROS   Negative for: Constitutional, Gastrointestinal, Neurological, Skin, Genitourinary, Musculoskeletal, HENT, Endocrine, Cardiovascular, Eyes, Respiratory, Psychiatric, Allergic/Imm, Heme/Lymph Last edited by Silvestre Moment on 01/23/2022  2:16 PM.       ALLERGIES Allergies  Allergen Reactions   Other Other (See Comments)    Allergic to makeup- rash-  Perfume, Hairspray- breathing difficulty and swelling    PAST MEDICAL HISTORY Past Medical History:  Diagnosis Date   Arthritis    "hands, shoulders, feet, back" (04/10/2015)   Chronic lower back pain    COPD (chronic obstructive pulmonary disease) (HCC)    Depression    Dysrhythmia    "nothing to worry about" per MD   Family history of adverse reaction to anesthesia    Mother had stroke as she came out of anesthesia for back surgery.  (Blood pressure was high   Frequent sinus infections    GERD (gastroesophageal reflux disease)    Heart murmur    Hypercholesterolemia    Migraine    "once/month now" (  04/10/2015)   Rash    "anywhere it wants to" chronic   Rash of neck    chronic   Shortness of breath dyspnea    with exertion   Spinal stenosis of lumbar region    Past Surgical History:  Procedure Laterality Date   25 GAUGE PARS PLANA VITRECTOMY WITH 20 GAUGE MVR PORT Right 04/10/2015   25 GAUGE PARS PLANA VITRECTOMY WITH 20 GAUGE MVR PORT Right 04/10/2015   Procedure: 25 GAUGE PARS PLANA VITRECTOMY WITH 20 GAUGE MVR PORT;  Surgeon: Hayden Pedro, MD;  Location: [place];  Service: Ophthalmology;  Laterality: Right;   ABDOMINAL HYSTERECTOMY     "still have my left ovary"   CARPAL TUNNEL RELEASE Bilateral    COLONOSCOPY WITH PROPOFOL N/A 07/06/2015   Procedure:  COLONOSCOPY WITH PROPOFOL;  Surgeon: Lollie Sails, MD;  Location: [facility] ENDOSCOPY;  Service: Endoscopy;  Laterality: N/A;   ESOPHAGOGASTRODUODENOSCOPY (EGD) WITH PROPOFOL N/A 07/06/2015   Procedure: ESOPHAGOGASTRODUODENOSCOPY (EGD) WITH PROPOFOL;  Surgeon: Lollie Sails, MD;  Location: [facility] ENDOSCOPY;  Service: Endoscopy;  Laterality: N/A;   ESOPHAGOGASTRODUODENOSCOPY (EGD) WITH PROPOFOL N/A 03/17/2017   Procedure: ESOPHAGOGASTRODUODENOSCOPY (EGD) WITH PROPOFOL;  Surgeon: Lollie Sails, MD;  Location: [facility] ENDOSCOPY;  Service: Endoscopy;  Laterality: N/A;   EYE SURGERY     FRACTURE SURGERY     GAS/FLUID EXCHANGE Right 04/10/2015   Procedure: GAS/FLUID EXCHANGE;  Surgeon: Hayden Pedro, MD;  Location: [place];  Service: Ophthalmology;  Laterality: Right;   LASER PHOTO ABLATION Right 04/10/2015   Procedure: LASER PHOTO ABLATION;  Surgeon: Hayden Pedro, MD;  Location: [place];  Service: Ophthalmology;  Laterality: Right;  Head scope laser   MEMBRANE PEEL Right 04/10/2015   Procedure: MEMBRANE PEEL;  Surgeon: Hayden Pedro, MD;  Location: [place];  Service: Ophthalmology;  Laterality: Right;   NASAL SINUS SURGERY     WRIST FRACTURE SURGERY Right     FAMILY HISTORY Family History  Problem Relation Age of Onset   Stroke Mother    Cancer - Lung Father     SOCIAL HISTORY Social History   Tobacco Use   Smoking status: Every Day    Packs/day: 1.00    Years: 40.00    Total pack years: 40.00    Types: Cigarettes   Smokeless tobacco: Never  Substance Use Topics   Alcohol use: No   Drug use: No         OPHTHALMIC EXAM:  Base Eye Exam     Visual Acuity (ETDRS)       Right Left   Dist [place] 20/400 20/25 -2   Dist ph [place] 20/200          Tonometry (Tonopen, 2:20 PM)       Right Left   Pressure 13 11         Pupils       Pupils APD   Right PERRL None   Left PERRL None         Visual Fields       Left Right    Full Full         Extraocular Movement        Right Left    Full Full         Neuro/Psych     Oriented x3: Yes         Dilation     Right eye: 2.5% Phenylephrine, 1.0% Mydriacyl @ 2:20 PM  Slit Lamp and Fundus Exam     External Exam       Right Left   External Normal Normal         Slit Lamp Exam       Right Left   Lids/Lashes Normal Entropion, lower lid easily repositioned with pulling of the lower lid   Conjunctiva/Sclera White and quiet White and quiet   Cornea Clear Clear   Anterior Chamber Deep and quiet Deep and quiet   Iris Round and reactive Round and reactive   Lens Centered posterior chamber intraocular lens 3+ Nuclear sclerosis,, dense brunescent NS   Anterior Vitreous Normal Normal         Fundus Exam       Right Left   Posterior Vitreous Clear avitric Normal   Disc Normal Normal   C/D Ratio 0.4 0.4   Macula Much less topographic distortion. Early age related macular degeneration, Hard drusen   Vessels Normal Normal   Periphery Anterior to equator very large chorioretinal laser applications. Normal            IMAGING AND PROCEDURES  Imaging and Procedures for 01/23/22           ASSESSMENT/PLAN:  Epiretinal membrane, right eye Postop day #1 looks great much less topographic distortion.     ICD-10-CM   1. Epiretinal membrane, right eye  H35.371       1.  OD looks great, continue with topical medications now and henceforth.  2.  She complains of lancinating type pain likely related to small abrasion on the corneal surface.  This will improve with another night sleep.  Used 1 drop out of each bottle 4 times daily until she returns  3.  Ophthalmic Meds Ordered this visit:  No orders of the defined types were placed in this encounter.      Return in about 1 week (around 01/30/2022) for dilate, OD, POST OP, OCT.  Patient Instructions  Ofloxacin  4 times daily to the operative eye  Prednisolone acetate 1 drop to the operative eye 4 times  daily  Patient instructed not to refill the medications and use them for maximum of 3 weeks.  Patient instructed do not rub the eye.  Patient has the option to use the patch at night.   No lifting and bending for 1 week. No water IN the eye for 10 days. Do not rub the eye. Wear shield at night for 1-3 days.  Continue your topical medications for a total of 3 weeks.  Do not refill your postoperative medications unless instructed.  Refrain from exercise or intentional activity which increases our heart rate above resting levels.  Normal walking to complete normal activities of your day are appropriate.  Driving:  Legally, you only need one good eye, of 20/40 or better to drive.  However, the practice does not recommend driving during first weeks after surgery, IF you are uncomfortable with your visual functioning or capabilities.   If you have known sleep apnea, wear your CPAP as you normally should. No lifting and bending for 1 week. No water IN the eye for 10 days. Do not rub the eye. Wear shield at night for 1-3 days.  Continue your topical medications for a total of 3 weeks.  Do not refill your postoperative medications unless instructed.  Refrain from exercise or intentional activity which increases our heart rate above resting levels.  Normal walking to complete normal activities of your day are appropriate.  Driving:  Legally, you only need one good eye, of 20/40 or better to drive.  However, the practice does not recommend driving during first weeks after surgery, IF you are uncomfortable with your visual functioning or capabilities.   If you have known sleep apnea, wear your CPAP as you normally should.    Explained the diagnoses, plan, and follow up with the patient and they expressed understanding.  Patient expressed understanding of the importance of proper follow up care.   Clent Demark Cricket Goodlin M.D. Diseases & Surgery of the Retina and Vitreous Retina & Diabetic [place] 01/23/22     Abbreviations: M myopia (nearsighted); A astigmatism; H hyperopia (farsighted); P presbyopia; Mrx spectacle prescription;  CTL contact lenses; OD right eye; OS left eye; OU both eyes  XT exotropia; ET esotropia; PEK punctate epithelial keratitis; PEE punctate epithelial erosions; DES dry eye syndrome; MGD meibomian gland dysfunction; ATs artificial tears; PFAT's preservative free artificial tears; [place] nuclear sclerotic cataract; PSC posterior subcapsular cataract; ERM epi-retinal membrane; PVD posterior vitreous detachment; RD retinal detachment; DM diabetes mellitus; DR diabetic retinopathy; NPDR non-proliferative diabetic retinopathy; PDR proliferative diabetic retinopathy; CSME clinically significant macular edema; DME diabetic macular edema; dbh dot blot hemorrhages; CWS cotton wool spot; POAG primary open angle glaucoma; C/D cup-to-disc ratio; HVF humphrey visual field; GVF goldmann visual field; OCT optical coherence tomography; IOP intraocular pressure; BRVO Branch retinal vein occlusion; CRVO central retinal vein occlusion; CRAO central retinal artery occlusion; BRAO branch retinal artery occlusion; RT retinal tear; SB scleral buckle; PPV pars plana vitrectomy; VH Vitreous hemorrhage; PRP panretinal laser photocoagulation; IVK intravitreal kenalog; VMT vitreomacular traction; MH Macular hole;  NVD neovascularization of the disc; NVE neovascularization elsewhere; AREDS age related eye disease study; ARMD age related macular degeneration; POAG primary open angle glaucoma; EBMD epithelial/anterior basement membrane dystrophy; ACIOL anterior chamber intraocular lens; IOL intraocular lens; PCIOL posterior chamber intraocular lens; Phaco/IOL phacoemulsification with intraocular lens placement; [place] photorefractive keratectomy; LASIK laser assisted in situ keratomileusis; HTN hypertension; DM diabetes mellitus; COPD chronic obstructive pulmonary disease

## 2022-01-23 NOTE — Patient Instructions (Signed)
Ofloxacin  4 times daily to the operative eye  Prednisolone acetate 1 drop to the operative eye 4 times daily  Patient instructed not to refill the medications and use them for maximum of 3 weeks.  Patient instructed do not rub the eye.  Patient has the option to use the patch at night.   No lifting and bending for 1 week. No water IN the eye for 10 days. Do not rub the eye. Wear shield at night for 1-3 days.  Continue your topical medications for a total of 3 weeks.  Do not refill your postoperative medications unless instructed.  Refrain from exercise or intentional activity which increases our heart rate above resting levels.  Normal walking to complete normal activities of your day are appropriate.  Driving:  Legally, you only need one good eye, of 20/40 or better to drive.  However, the practice does not recommend driving during first weeks after surgery, IF you are uncomfortable with your visual functioning or capabilities.   If you have known sleep apnea, wear your CPAP as you normally should. No lifting and bending for 1 week. No water IN the eye for 10 days. Do not rub the eye. Wear shield at night for 1-3 days.  Continue your topical medications for a total of 3 weeks.  Do not refill your postoperative medications unless instructed.  Refrain from exercise or intentional activity which increases our heart rate above resting levels.  Normal walking to complete normal activities of your day are appropriate.  Driving:  Legally, you only need one good eye, of 20/40 or better to drive.  However, the practice does not recommend driving during first weeks after surgery, IF you are uncomfortable with your visual functioning or capabilities.   If you have known sleep apnea, wear your CPAP as you normally should.

## 2022-01-29 ENCOUNTER — Ambulatory Visit (INDEPENDENT_AMBULATORY_CARE_PROVIDER_SITE_OTHER): Payer: PPO | Admitting: Ophthalmology

## 2022-01-29 ENCOUNTER — Encounter (INDEPENDENT_AMBULATORY_CARE_PROVIDER_SITE_OTHER): Payer: Self-pay | Admitting: Ophthalmology

## 2022-01-29 DIAGNOSIS — H35371 Puckering of macula, right eye: Secondary | ICD-10-CM | POA: Diagnosis not present

## 2022-01-29 NOTE — Patient Instructions (Signed)
Patient to use Pred forte 1 drop right eye daily  Patient to use Ocuflox 1 drop right eye daily each of these for only 2 more weeks and then discontinue

## 2022-01-29 NOTE — Assessment & Plan Note (Signed)
Much improved macular findings on clinical examination.  Slight increase in thickening secondary to the severe topographic distortion induced by prior ERM and ILM changes.  Now resolved.

## 2022-01-29 NOTE — Progress Notes (Signed)
01/29/2022     CHIEF COMPLAINT Patient presents for  Chief Complaint  Patient presents with   Post-op Follow-up      HISTORY OF PRESENT ILLNESS: Stephanie Powers is a 71 y.o. female who presents to the clinic today for:     Referring physician: Cyndi Bender, PA-C [place],  [place] [address]  HISTORICAL INFORMATION:   Selected notes from the MEDICAL RECORD NUMBER       CURRENT MEDICATIONS: Current Outpatient Medications (Ophthalmic Drugs)  Medication Sig   Polyvinyl Alcohol-Povidone (REFRESH OP) Place 1 drop into both eyes every 4 (four) hours as needed (dry eyes).   No current facility-administered medications for this visit. (Ophthalmic Drugs)   Current Outpatient Medications (Other)  Medication Sig   albuterol (PROVENTIL HFA;VENTOLIN HFA) 108 (90 BASE) MCG/ACT inhaler Inhale 1-2 puffs into the lungs every 6 (six) hours as needed for wheezing or shortness of breath.   atorvastatin (LIPITOR) 40 MG tablet Take 40 mg by mouth every morning.   cetirizine (ZYRTEC) 10 MG tablet Take 20 mg by mouth daily.    dicyclomine (BENTYL) 10 MG capsule Take 20 mg by mouth 2 (two) times daily before a meal.   HYDROcodone-acetaminophen (NORCO/VICODIN) 5-325 MG tablet Take 1 tablet by mouth every 4 (four) hours as needed.   Liniments (SALONPAS ARTHRITIS PAIN RELIEF) PADS Apply 1 each topically daily as needed (pain).    pantoprazole (PROTONIX) 40 MG tablet Take 40 mg by mouth 2 (two) times daily with a meal.   sertraline (ZOLOFT) 100 MG tablet Take 150 mg by mouth daily.    traZODone (DESYREL) 100 MG tablet Take 100 mg by mouth at bedtime.   triamcinolone cream (KENALOG) 0.1 % Apply 1 application topically at bedtime as needed (rash).    No current facility-administered medications for this visit. (Other)      REVIEW OF SYSTEMS: ROS   Negative for: Constitutional, Gastrointestinal, Neurological, Skin, Genitourinary, Musculoskeletal, HENT, Endocrine,  Cardiovascular, Eyes, Respiratory, Psychiatric, Allergic/Imm, Heme/Lymph Last edited by Hurman Horn, MD on 01/29/2022 10:59 AM.       ALLERGIES Allergies  Allergen Reactions   Other Other (See Comments)    Allergic to makeup- rash-  Perfume, Hairspray- breathing difficulty and swelling    PAST MEDICAL HISTORY Past Medical History:  Diagnosis Date   Arthritis    "hands, shoulders, feet, back" (04/10/2015)   Chronic lower back pain    COPD (chronic obstructive pulmonary disease) ([place])    Depression    Dysrhythmia    "nothing to worry about" per MD   Family history of adverse reaction to anesthesia    Mother had stroke as she came out of anesthesia for back surgery.  (Blood pressure was high   Frequent sinus infections    GERD (gastroesophageal reflux disease)    Heart murmur    Hypercholesterolemia    Migraine    "once/month now" (04/10/2015)   Rash    "anywhere it wants to" chronic   Rash of neck    chronic   Shortness of breath dyspnea    with exertion   Spinal stenosis of lumbar region    Past Surgical History:  Procedure Laterality Date   25 GAUGE PARS PLANA VITRECTOMY WITH 20 GAUGE MVR PORT Right 04/10/2015   25 GAUGE PARS PLANA VITRECTOMY WITH 20 GAUGE MVR PORT Right 04/10/2015   Procedure: 25 GAUGE PARS PLANA VITRECTOMY WITH 20 GAUGE MVR PORT;  Surgeon: Hayden Pedro, MD;  Location[facility]  OR;  Service: Ophthalmology;  Laterality: Right;   ABDOMINAL HYSTERECTOMY     "still have my left ovary"   CARPAL TUNNEL RELEASE Bilateral    COLONOSCOPY WITH PROPOFOL N/A 07/06/2015   Procedure: COLONOSCOPY WITH PROPOFOL;  Surgeon: Lollie Sails, MD;  Location: [facility] ENDOSCOPY;  Service: Endoscopy;  Laterality: N/A;   ESOPHAGOGASTRODUODENOSCOPY (EGD) WITH PROPOFOL N/A 07/06/2015   Procedure: ESOPHAGOGASTRODUODENOSCOPY (EGD) WITH PROPOFOL;  Surgeon: Lollie Sails, MD;  Location: [facility] ENDOSCOPY;  Service: Endoscopy;  Laterality: N/A;   ESOPHAGOGASTRODUODENOSCOPY (EGD) WITH  PROPOFOL N/A 03/17/2017   Procedure: ESOPHAGOGASTRODUODENOSCOPY (EGD) WITH PROPOFOL;  Surgeon: Lollie Sails, MD;  Location: [facility] ENDOSCOPY;  Service: Endoscopy;  Laterality: N/A;   EYE SURGERY     FRACTURE SURGERY     GAS/FLUID EXCHANGE Right 04/10/2015   Procedure: GAS/FLUID EXCHANGE;  Surgeon: Hayden Pedro, MD;  Location: [place];  Service: Ophthalmology;  Laterality: Right;   LASER PHOTO ABLATION Right 04/10/2015   Procedure: LASER PHOTO ABLATION;  Surgeon: Hayden Pedro, MD;  Location: [place];  Service: Ophthalmology;  Laterality: Right;  Head scope laser   MEMBRANE PEEL Right 04/10/2015   Procedure: MEMBRANE PEEL;  Surgeon: Hayden Pedro, MD;  Location: [place];  Service: Ophthalmology;  Laterality: Right;   NASAL SINUS SURGERY     WRIST FRACTURE SURGERY Right     FAMILY HISTORY Family History  Problem Relation Age of Onset   Stroke Mother    Cancer - Lung Father     SOCIAL HISTORY Social History   Tobacco Use   Smoking status: Every Day    Packs/day: 1.00    Years: 40.00    Total pack years: 40.00    Types: Cigarettes   Smokeless tobacco: Never  Substance Use Topics   Alcohol use: No   Drug use: No         OPHTHALMIC EXAM:  Base Eye Exam     Visual Acuity (ETDRS)       Right Left   Dist [place] 20/70 20/25 -2   Dist ph  NI          Tonometry (Tonopen, 11:01 AM)       Right Left   Pressure 21 14         Pupils       Pupils APD   Right PERRL None   Left PERRL          Visual Fields       Left Right    Full Full         Extraocular Movement       Right Left    Full, Ortho Full, Ortho         Neuro/Psych     Oriented x3: Yes   Mood/Affect: Normal         Dilation     Right eye: 1.0% Mydriacyl, 2.5% Phenylephrine @ 11:01 AM           Slit Lamp and Fundus Exam     External Exam       Right Left   External Normal Normal         Slit Lamp Exam       Right Left   Lids/Lashes Normal Entropion, lower lid  easily repositioned with pulling of the lower lid   Conjunctiva/Sclera White and quiet White and quiet   Cornea Clear Clear   Anterior Chamber Deep and quiet Deep and quiet   Iris Round and reactive Round  and reactive   Lens Centered posterior chamber intraocular lens 3+ Nuclear sclerosis,, dense brunescent NS   Anterior Vitreous Normal Normal         Fundus Exam       Right Left   Posterior Vitreous Clear avitric    Disc Normal    C/D Ratio 0.4    Macula Much less topographic distortion.    Vessels Normal    Periphery Anterior to equator very large chorioretinal laser applications.             IMAGING AND PROCEDURES  Imaging and Procedures for 01/29/22  OCT, Retina - OU - Both Eyes       Right Eye Quality was good. Scan locations included subfoveal. Central Foveal Thickness: 586. Progression has been stable. Findings include retinal drusen , cystoid macular edema, epiretinal membrane.   Left Eye Quality was good. Scan locations included subfoveal. Central Foveal Thickness: 286. Progression has been stable. Findings include normal foveal contour, retinal drusen .   Notes No residual epiretinal or ILM remains.  Thickening secondary to the trauma of ILM peeling and tractional change at that time.  OS and OD with minor drusen of the retina no sign of CNVM either eye             ASSESSMENT/PLAN:  Epiretinal membrane, right eye Much improved macular findings on clinical examination.  Slight increase in thickening secondary to the severe topographic distortion induced by prior ERM and ILM changes.  Now resolved.      ICD-10-CM   1. Epiretinal membrane, right eye  H35.371 OCT, Retina - OU - Both Eyes      1.  OD slight asymmetry and IOP.  We will taper medications topically once daily to the right eye for 2 more weeks then discontinue  2.  Dilate OD 4 to 5 weeks and follow-up in OCT  3.  Ophthalmic Meds Ordered this visit:  No orders of the defined types  were placed in this encounter.      Return in about 4 weeks (around 02/26/2022) for dilate, OD, OCT.  Patient Instructions  Patient to use Pred forte 1 drop right eye daily  Patient to use Ocuflox 1 drop right eye daily each of these for only 2 more weeks and then discontinue  Explained the diagnoses, plan, and follow up with the patient and they expressed understanding.  Patient expressed understanding of the importance of proper follow up care.   Clent Demark Mylie Mccurley M.D. Diseases & Surgery of the Retina and Vitreous Retina & Diabetic [place] 01/29/22     Abbreviations: M myopia (nearsighted); A astigmatism; H hyperopia (farsighted); P presbyopia; Mrx spectacle prescription;  CTL contact lenses; OD right eye; OS left eye; OU both eyes  XT exotropia; ET esotropia; PEK punctate epithelial keratitis; PEE punctate epithelial erosions; DES dry eye syndrome; MGD meibomian gland dysfunction; ATs artificial tears; PFAT's preservative free artificial tears; [place] nuclear sclerotic cataract; PSC posterior subcapsular cataract; ERM epi-retinal membrane; PVD posterior vitreous detachment; RD retinal detachment; DM diabetes mellitus; DR diabetic retinopathy; NPDR non-proliferative diabetic retinopathy; PDR proliferative diabetic retinopathy; CSME clinically significant macular edema; DME diabetic macular edema; dbh dot blot hemorrhages; CWS cotton wool spot; POAG primary open angle glaucoma; C/D cup-to-disc ratio; HVF humphrey visual field; GVF goldmann visual field; OCT optical coherence tomography; IOP intraocular pressure; BRVO Branch retinal vein occlusion; CRVO central retinal vein occlusion; CRAO central retinal artery occlusion; BRAO branch retinal artery occlusion; RT retinal tear; SB scleral buckle; PPV  pars plana vitrectomy; VH Vitreous hemorrhage; PRP panretinal laser photocoagulation; IVK intravitreal kenalog; VMT vitreomacular traction; MH Macular hole;  NVD neovascularization of the disc; NVE  neovascularization elsewhere; AREDS age related eye disease study; ARMD age related macular degeneration; POAG primary open angle glaucoma; EBMD epithelial/anterior basement membrane dystrophy; ACIOL anterior chamber intraocular lens; IOL intraocular lens; PCIOL posterior chamber intraocular lens; Phaco/IOL phacoemulsification with intraocular lens placement; [place] photorefractive keratectomy; LASIK laser assisted in situ keratomileusis; HTN hypertension; DM diabetes mellitus; COPD chronic obstructive pulmonary disease

## 2022-01-30 ENCOUNTER — Encounter (INDEPENDENT_AMBULATORY_CARE_PROVIDER_SITE_OTHER): Payer: PPO | Admitting: Ophthalmology

## 2022-02-19 DIAGNOSIS — Z79899 Other long term (current) drug therapy: Secondary | ICD-10-CM | POA: Diagnosis not present

## 2022-02-19 DIAGNOSIS — Z139 Encounter for screening, unspecified: Secondary | ICD-10-CM | POA: Diagnosis not present

## 2022-02-19 DIAGNOSIS — J449 Chronic obstructive pulmonary disease, unspecified: Secondary | ICD-10-CM | POA: Diagnosis not present

## 2022-02-19 DIAGNOSIS — F3341 Major depressive disorder, recurrent, in partial remission: Secondary | ICD-10-CM | POA: Diagnosis not present

## 2022-02-19 DIAGNOSIS — K219 Gastro-esophageal reflux disease without esophagitis: Secondary | ICD-10-CM | POA: Diagnosis not present

## 2022-02-19 DIAGNOSIS — E7800 Pure hypercholesterolemia, unspecified: Secondary | ICD-10-CM | POA: Diagnosis not present

## 2022-02-19 DIAGNOSIS — Z87891 Personal history of nicotine dependence: Secondary | ICD-10-CM | POA: Diagnosis not present

## 2022-02-19 DIAGNOSIS — M48061 Spinal stenosis, lumbar region without neurogenic claudication: Secondary | ICD-10-CM | POA: Diagnosis not present

## 2022-02-20 ENCOUNTER — Other Ambulatory Visit: Payer: Self-pay | Admitting: Physician Assistant

## 2022-02-20 DIAGNOSIS — Z87891 Personal history of nicotine dependence: Secondary | ICD-10-CM

## 2022-02-27 ENCOUNTER — Encounter (INDEPENDENT_AMBULATORY_CARE_PROVIDER_SITE_OTHER): Payer: PPO | Admitting: Ophthalmology

## 2022-02-27 ENCOUNTER — Encounter (INDEPENDENT_AMBULATORY_CARE_PROVIDER_SITE_OTHER): Payer: Self-pay | Admitting: Ophthalmology

## 2022-02-27 ENCOUNTER — Ambulatory Visit (INDEPENDENT_AMBULATORY_CARE_PROVIDER_SITE_OTHER): Payer: PPO | Admitting: Ophthalmology

## 2022-02-27 DIAGNOSIS — H353112 Nonexudative age-related macular degeneration, right eye, intermediate dry stage: Secondary | ICD-10-CM

## 2022-02-27 DIAGNOSIS — H35371 Puckering of macula, right eye: Secondary | ICD-10-CM

## 2022-02-27 DIAGNOSIS — H2512 Age-related nuclear cataract, left eye: Secondary | ICD-10-CM

## 2022-02-27 DIAGNOSIS — H353121 Nonexudative age-related macular degeneration, left eye, early dry stage: Secondary | ICD-10-CM

## 2022-02-27 NOTE — Assessment & Plan Note (Signed)
Minor OD

## 2022-02-27 NOTE — Assessment & Plan Note (Signed)
Some 6 weeks post vitrectomy membrane peel looks great improving acuity.

## 2022-02-27 NOTE — Progress Notes (Signed)
02/27/2022     CHIEF COMPLAINT Patient presents for  Chief Complaint  Patient presents with   Epiretinal Membrane/preretinal Fibrosis/cellophane Maculopathy    Post vitrectomy membrane peel right eye 01-14-2022, slowly improving.  HISTORY OF PRESENT ILLNESS: Stephanie Powers is a 71 y.o. female who presents to the clinic today for:   HPI   4 weeks for DILATE OD, OCT. 01/30/22 Pt stated vision may have worsened. Pt reports, "I think its my depth perception. I would pour myself a cup of tea and I would miss the cup. Half of the tea would go in and the other half would be on the counter."  Last edited by Silvestre Moment on 02/27/2022  1:46 PM.      Referring physician: Debbra Riding, MD [address] [place],  [place] [address]  HISTORICAL INFORMATION:   Selected notes from the MEDICAL RECORD NUMBER       CURRENT MEDICATIONS: Current Outpatient Medications (Ophthalmic Drugs)  Medication Sig   Polyvinyl Alcohol-Povidone (REFRESH OP) Place 1 drop into both eyes every 4 (four) hours as needed (dry eyes).   No current facility-administered medications for this visit. (Ophthalmic Drugs)   Current Outpatient Medications (Other)  Medication Sig   albuterol (PROVENTIL HFA;VENTOLIN HFA) 108 (90 BASE) MCG/ACT inhaler Inhale 1-2 puffs into the lungs every 6 (six) hours as needed for wheezing or shortness of breath.   atorvastatin (LIPITOR) 40 MG tablet Take 40 mg by mouth every morning.   cetirizine (ZYRTEC) 10 MG tablet Take 20 mg by mouth daily.    dicyclomine (BENTYL) 10 MG capsule Take 20 mg by mouth 2 (two) times daily before a meal.   HYDROcodone-acetaminophen (NORCO/VICODIN) 5-325 MG tablet Take 1 tablet by mouth every 4 (four) hours as needed.   Liniments (SALONPAS ARTHRITIS PAIN RELIEF) PADS Apply 1 each topically daily as needed (pain).    pantoprazole (PROTONIX) 40 MG tablet Take 40 mg by mouth 2 (two) times daily with a meal.   sertraline (ZOLOFT) 100 MG tablet Take 150  mg by mouth daily.    traZODone (DESYREL) 100 MG tablet Take 100 mg by mouth at bedtime.   triamcinolone cream (KENALOG) 0.1 % Apply 1 application topically at bedtime as needed (rash).    No current facility-administered medications for this visit. (Other)      REVIEW OF SYSTEMS: ROS   Negative for: Constitutional, Gastrointestinal, Neurological, Skin, Genitourinary, Musculoskeletal, HENT, Endocrine, Cardiovascular, Eyes, Respiratory, Psychiatric, Allergic/Imm, Heme/Lymph Last edited by Silvestre Moment on 02/27/2022  1:47 PM.       ALLERGIES Allergies  Allergen Reactions   Other Other (See Comments)    Allergic to makeup- rash-  Perfume, Hairspray- breathing difficulty and swelling    PAST MEDICAL HISTORY Past Medical History:  Diagnosis Date   Arthritis    "hands, shoulders, feet, back" (04/10/2015)   Chronic lower back pain    COPD (chronic obstructive pulmonary disease) ([place])    Depression    Dysrhythmia    "nothing to worry about" per MD   Family history of adverse reaction to anesthesia    Mother had stroke as she came out of anesthesia for back surgery.  (Blood pressure was high   Frequent sinus infections    GERD (gastroesophageal reflux disease)    Heart murmur    Hypercholesterolemia    Migraine    "once/month now" (04/10/2015)   Rash    "anywhere it wants to" chronic   Rash of neck  chronic   Shortness of breath dyspnea    with exertion   Spinal stenosis of lumbar region    Past Surgical History:  Procedure Laterality Date   25 GAUGE PARS PLANA VITRECTOMY WITH 20 GAUGE MVR PORT Right 04/10/2015   25 GAUGE PARS PLANA VITRECTOMY WITH 20 GAUGE MVR PORT Right 04/10/2015   Procedure: 25 GAUGE PARS PLANA VITRECTOMY WITH 20 GAUGE MVR PORT;  Surgeon: Hayden Pedro, MD;  Location: [facility];  Service: Ophthalmology;  Laterality: Right;   ABDOMINAL HYSTERECTOMY     "still have my left ovary"   CARPAL TUNNEL RELEASE Bilateral    COLONOSCOPY WITH PROPOFOL N/A 07/06/2015    Procedure: COLONOSCOPY WITH PROPOFOL;  Surgeon: Lollie Sails, MD;  Location: [facility] ENDOSCOPY;  Service: Endoscopy;  Laterality: N/A;   ESOPHAGOGASTRODUODENOSCOPY (EGD) WITH PROPOFOL N/A 07/06/2015   Procedure: ESOPHAGOGASTRODUODENOSCOPY (EGD) WITH PROPOFOL;  Surgeon: Lollie Sails, MD;  Location: [facility] ENDOSCOPY;  Service: Endoscopy;  Laterality: N/A;   ESOPHAGOGASTRODUODENOSCOPY (EGD) WITH PROPOFOL N/A 03/17/2017   Procedure: ESOPHAGOGASTRODUODENOSCOPY (EGD) WITH PROPOFOL;  Surgeon: Lollie Sails, MD;  Location: [facility] ENDOSCOPY;  Service: Endoscopy;  Laterality: N/A;   EYE SURGERY     FRACTURE SURGERY     GAS/FLUID EXCHANGE Right 04/10/2015   Procedure: GAS/FLUID EXCHANGE;  Surgeon: Hayden Pedro, MD;  Location: [place];  Service: Ophthalmology;  Laterality: Right;   LASER PHOTO ABLATION Right 04/10/2015   Procedure: LASER PHOTO ABLATION;  Surgeon: Hayden Pedro, MD;  Location: [place];  Service: Ophthalmology;  Laterality: Right;  Head scope laser   MEMBRANE PEEL Right 04/10/2015   Procedure: MEMBRANE PEEL;  Surgeon: Hayden Pedro, MD;  Location: [place];  Service: Ophthalmology;  Laterality: Right;   NASAL SINUS SURGERY     WRIST FRACTURE SURGERY Right     FAMILY HISTORY Family History  Problem Relation Age of Onset   Stroke Mother    Cancer - Lung Father     SOCIAL HISTORY Social History   Tobacco Use   Smoking status: Every Day    Packs/day: 1.00    Years: 40.00    Total pack years: 40.00    Types: Cigarettes   Smokeless tobacco: Never  Substance Use Topics   Alcohol use: No   Drug use: No         OPHTHALMIC EXAM:  Base Eye Exam     Visual Acuity (ETDRS)       Right Left   Dist [place] 20/70 20/25 -2   Dist ph [place] NI          Tonometry (Tonopen, 1:51 PM)       Right Left   Pressure 19 14         Pupils       Pupils APD   Right PERRL None   Left PERRL None         Visual Fields       Left Right    Full Full         Extraocular  Movement       Right Left    Full Full         Neuro/Psych     Oriented x3: Yes   Mood/Affect: Normal         Dilation     Right eye: 2.5% Phenylephrine, 1.0% Mydriacyl @ 1:51 PM           Slit Lamp and Fundus Exam     External Exam  Right Left   External Normal Normal         Slit Lamp Exam       Right Left   Lids/Lashes Normal Entropion, lower lid easily repositioned with pulling of the lower lid   Conjunctiva/Sclera White and quiet White and quiet   Cornea Clear Clear   Anterior Chamber Deep and quiet Deep and quiet   Iris Round and reactive Round and reactive   Lens Centered posterior chamber intraocular lens 3+ Nuclear sclerosis,, dense brunescent NS   Anterior Vitreous Normal Normal         Fundus Exam       Right Left   Posterior Vitreous Clear avitric    Disc Normal    C/D Ratio 0.4    Macula Much less topographic distortion.    Vessels Normal    Periphery Anterior to equator very large chorioretinal laser applications.             IMAGING AND PROCEDURES  Imaging and Procedures for 02/27/22  OCT, Retina - OU - Both Eyes       Right Eye Quality was good. Scan locations included subfoveal. Central Foveal Thickness: 512. Progression has improved. Findings include retinal drusen , cystoid macular edema.   Left Eye Quality was good. Scan locations included subfoveal. Central Foveal Thickness: 286. Progression has been stable. Findings include normal foveal contour, retinal drusen .   Notes No residual epiretinal or ILM remains.  Less thickening than recent visit as well as preoperative, accounts for improving acuity.  OS and OD with minor drusen of the retina no sign of CNVM either eye              ASSESSMENT/PLAN:  Epiretinal membrane, right eye Some 6 weeks post vitrectomy membrane peel looks great improving acuity.  Intermediate stage nonexudative age-related macular degeneration of right eye Minor  OD  Nuclear sclerotic cataract of left eye Follow-up Dr. Wyatt Portela cataract surgery left eye at any time  Early stage nonexudative age-related macular degeneration of left eye Minor OS     ICD-10-CM   1. Epiretinal membrane, right eye  H35.371 OCT, Retina - OU - Both Eyes    2. Intermediate stage nonexudative age-related macular degeneration of right eye  H35.3112     3. Nuclear sclerotic cataract of left eye  H25.12     4. Early stage nonexudative age-related macular degeneration of left eye  H35.3121       1.  OD vastly improved and slowly improving macular thickening post vitrectomy membrane peel for very severe epiretinal membrane  2.  OS pseudophakia is a desired state and I do recommend patient follow-up with Dr. Wyatt Portela for consideration of cataract surgery left eye for balancing purposes as she is having "the eyes are fighting".  3.  Ophthalmic Meds Ordered this visit:  No orders of the defined types were placed in this encounter.      Return in about 6 months (around 08/30/2022) for DILATE OU, OCT.  There are no Patient Instructions on file for this visit.   Explained the diagnoses, plan, and follow up with the patient and they expressed understanding.  Patient expressed understanding of the importance of proper follow up care.   Clent Demark Naida Escalante M.D. Diseases & Surgery of the Retina and Vitreous [facility] [place] 02/27/22     Abbreviations: M myopia (nearsighted); A astigmatism; H hyperopia (farsighted); P presbyopia; Mrx spectacle prescription;  CTL contact lenses; OD right eye; OS  left eye; OU both eyes  XT exotropia; ET esotropia; PEK punctate epithelial keratitis; PEE punctate epithelial erosions; DES dry eye syndrome; MGD meibomian gland dysfunction; ATs artificial tears; PFAT's preservative free artificial tears; Rolfe nuclear sclerotic cataract; PSC posterior subcapsular cataract; ERM epi-retinal membrane; PVD posterior vitreous detachment;  RD retinal detachment; DM diabetes mellitus; DR diabetic retinopathy; NPDR non-proliferative diabetic retinopathy; PDR proliferative diabetic retinopathy; CSME clinically significant macular edema; DME diabetic macular edema; dbh dot blot hemorrhages; CWS cotton wool spot; POAG primary open angle glaucoma; C/D cup-to-disc ratio; HVF humphrey visual field; GVF goldmann visual field; OCT optical coherence tomography; IOP intraocular pressure; BRVO Branch retinal vein occlusion; CRVO central retinal vein occlusion; CRAO central retinal artery occlusion; BRAO branch retinal artery occlusion; RT retinal tear; SB scleral buckle; PPV pars plana vitrectomy; VH Vitreous hemorrhage; PRP panretinal laser photocoagulation; IVK intravitreal kenalog; VMT vitreomacular traction; MH Macular hole;  NVD neovascularization of the disc; NVE neovascularization elsewhere; AREDS age related eye disease study; ARMD age related macular degeneration; POAG primary open angle glaucoma; EBMD epithelial/anterior basement membrane dystrophy; ACIOL anterior chamber intraocular lens; IOL intraocular lens; PCIOL posterior chamber intraocular lens; Phaco/IOL phacoemulsification with intraocular lens placement; [place] photorefractive keratectomy; LASIK laser assisted in situ keratomileusis; HTN hypertension; DM diabetes mellitus; COPD chronic obstructive pulmonary disease

## 2022-02-27 NOTE — Assessment & Plan Note (Signed)
Minor OS 

## 2022-02-27 NOTE — Assessment & Plan Note (Signed)
Follow-up Dr. Wyatt Portela cataract surgery left eye at any time

## 2022-03-05 ENCOUNTER — Ambulatory Visit
Admission: RE | Admit: 2022-03-05 | Discharge: 2022-03-05 | Disposition: A | Discharge status: Home / Self Care | Payer: PPO | Source: Ambulatory Visit | Attending: Physician Assistant | Admitting: Physician Assistant

## 2022-03-05 DIAGNOSIS — F17210 Nicotine dependence, cigarettes, uncomplicated: Secondary | ICD-10-CM | POA: Diagnosis not present

## 2022-03-05 DIAGNOSIS — Z87891 Personal history of nicotine dependence: Secondary | ICD-10-CM | POA: Insufficient documentation

## 2022-03-17 DIAGNOSIS — Z961 Presence of intraocular lens: Secondary | ICD-10-CM | POA: Diagnosis not present

## 2022-03-17 DIAGNOSIS — H02035 Senile entropion of left lower eyelid: Secondary | ICD-10-CM | POA: Diagnosis not present

## 2022-03-17 DIAGNOSIS — H35371 Puckering of macula, right eye: Secondary | ICD-10-CM | POA: Diagnosis not present

## 2022-03-17 DIAGNOSIS — H25812 Combined forms of age-related cataract, left eye: Secondary | ICD-10-CM | POA: Diagnosis not present

## 2022-03-17 DIAGNOSIS — H3581 Retinal edema: Secondary | ICD-10-CM | POA: Diagnosis not present

## 2022-04-30 DIAGNOSIS — H02006 Unspecified entropion of left eye, unspecified eyelid: Secondary | ICD-10-CM | POA: Diagnosis not present

## 2022-05-20 DIAGNOSIS — H02005 Unspecified entropion of left lower eyelid: Secondary | ICD-10-CM | POA: Diagnosis not present

## 2022-05-20 DIAGNOSIS — D23122 Other benign neoplasm of skin of left lower eyelid, including canthus: Secondary | ICD-10-CM | POA: Diagnosis not present

## 2022-05-20 DIAGNOSIS — D485 Neoplasm of uncertain behavior of skin: Secondary | ICD-10-CM | POA: Diagnosis not present

## 2022-06-10 DIAGNOSIS — E785 Hyperlipidemia, unspecified: Secondary | ICD-10-CM | POA: Diagnosis not present

## 2022-06-10 DIAGNOSIS — J309 Allergic rhinitis, unspecified: Secondary | ICD-10-CM | POA: Diagnosis not present

## 2022-06-10 DIAGNOSIS — G8929 Other chronic pain: Secondary | ICD-10-CM | POA: Diagnosis not present

## 2022-06-10 DIAGNOSIS — F330 Major depressive disorder, recurrent, mild: Secondary | ICD-10-CM | POA: Diagnosis not present

## 2022-06-10 DIAGNOSIS — K219 Gastro-esophageal reflux disease without esophagitis: Secondary | ICD-10-CM | POA: Diagnosis not present

## 2022-06-10 DIAGNOSIS — L309 Dermatitis, unspecified: Secondary | ICD-10-CM | POA: Diagnosis not present

## 2022-06-10 DIAGNOSIS — F419 Anxiety disorder, unspecified: Secondary | ICD-10-CM | POA: Diagnosis not present

## 2022-06-10 DIAGNOSIS — J449 Chronic obstructive pulmonary disease, unspecified: Secondary | ICD-10-CM | POA: Diagnosis not present

## 2022-06-10 DIAGNOSIS — G4700 Insomnia, unspecified: Secondary | ICD-10-CM | POA: Diagnosis not present

## 2022-06-10 DIAGNOSIS — K3184 Gastroparesis: Secondary | ICD-10-CM | POA: Diagnosis not present

## 2022-06-10 DIAGNOSIS — G629 Polyneuropathy, unspecified: Secondary | ICD-10-CM | POA: Diagnosis not present

## 2022-06-10 DIAGNOSIS — F17210 Nicotine dependence, cigarettes, uncomplicated: Secondary | ICD-10-CM | POA: Diagnosis not present

## 2022-07-24 DIAGNOSIS — H02005 Unspecified entropion of left lower eyelid: Secondary | ICD-10-CM | POA: Diagnosis not present

## 2022-08-25 DIAGNOSIS — E7800 Pure hypercholesterolemia, unspecified: Secondary | ICD-10-CM | POA: Diagnosis not present

## 2022-08-25 DIAGNOSIS — F3341 Major depressive disorder, recurrent, in partial remission: Secondary | ICD-10-CM | POA: Diagnosis not present

## 2022-08-25 DIAGNOSIS — Z23 Encounter for immunization: Secondary | ICD-10-CM | POA: Diagnosis not present

## 2022-08-25 DIAGNOSIS — J449 Chronic obstructive pulmonary disease, unspecified: Secondary | ICD-10-CM | POA: Diagnosis not present

## 2022-08-25 DIAGNOSIS — Z9181 History of falling: Secondary | ICD-10-CM | POA: Diagnosis not present

## 2022-08-25 DIAGNOSIS — K219 Gastro-esophageal reflux disease without esophagitis: Secondary | ICD-10-CM | POA: Diagnosis not present

## 2022-08-25 DIAGNOSIS — N182 Chronic kidney disease, stage 2 (mild): Secondary | ICD-10-CM | POA: Diagnosis not present

## 2022-08-25 DIAGNOSIS — M48061 Spinal stenosis, lumbar region without neurogenic claudication: Secondary | ICD-10-CM | POA: Diagnosis not present

## 2022-08-28 DIAGNOSIS — E7800 Pure hypercholesterolemia, unspecified: Secondary | ICD-10-CM | POA: Diagnosis not present

## 2022-08-28 DIAGNOSIS — N182 Chronic kidney disease, stage 2 (mild): Secondary | ICD-10-CM | POA: Diagnosis not present

## 2022-09-02 ENCOUNTER — Encounter (INDEPENDENT_AMBULATORY_CARE_PROVIDER_SITE_OTHER): Payer: PPO | Admitting: Ophthalmology

## 2022-09-02 DIAGNOSIS — H35371 Puckering of macula, right eye: Secondary | ICD-10-CM | POA: Diagnosis not present

## 2022-09-02 DIAGNOSIS — H353112 Nonexudative age-related macular degeneration, right eye, intermediate dry stage: Secondary | ICD-10-CM | POA: Diagnosis not present

## 2022-09-02 DIAGNOSIS — H35351 Cystoid macular degeneration, right eye: Secondary | ICD-10-CM | POA: Diagnosis not present

## 2022-09-02 DIAGNOSIS — H353121 Nonexudative age-related macular degeneration, left eye, early dry stage: Secondary | ICD-10-CM | POA: Diagnosis not present

## 2022-09-02 DIAGNOSIS — H2512 Age-related nuclear cataract, left eye: Secondary | ICD-10-CM | POA: Diagnosis not present

## 2022-11-05 DIAGNOSIS — Z20822 Contact with and (suspected) exposure to covid-19: Secondary | ICD-10-CM | POA: Diagnosis not present

## 2022-11-05 DIAGNOSIS — R110 Nausea: Secondary | ICD-10-CM | POA: Diagnosis not present

## 2022-11-05 DIAGNOSIS — B349 Viral infection, unspecified: Secondary | ICD-10-CM | POA: Diagnosis not present

## 2022-11-05 DIAGNOSIS — R6889 Other general symptoms and signs: Secondary | ICD-10-CM | POA: Diagnosis not present

## 2022-12-16 ENCOUNTER — Emergency Department: Payer: No Typology Code available for payment source

## 2022-12-16 ENCOUNTER — Inpatient Hospital Stay
Admission: EM | Admit: 2022-12-16 | Discharge: 2022-12-18 | DRG: 312 | Disposition: A | Discharge status: Home / Self Care | Payer: No Typology Code available for payment source | Attending: Student in an Organized Health Care Education/Training Program | Admitting: Student in an Organized Health Care Education/Training Program

## 2022-12-16 ENCOUNTER — Encounter: Payer: Self-pay | Admitting: Emergency Medicine

## 2022-12-16 ENCOUNTER — Other Ambulatory Visit: Payer: Self-pay

## 2022-12-16 DIAGNOSIS — J449 Chronic obstructive pulmonary disease, unspecified: Secondary | ICD-10-CM | POA: Diagnosis present

## 2022-12-16 DIAGNOSIS — S3993XA Unspecified injury of pelvis, initial encounter: Secondary | ICD-10-CM | POA: Diagnosis not present

## 2022-12-16 DIAGNOSIS — F17210 Nicotine dependence, cigarettes, uncomplicated: Secondary | ICD-10-CM | POA: Diagnosis present

## 2022-12-16 DIAGNOSIS — Z9981 Dependence on supplemental oxygen: Secondary | ICD-10-CM

## 2022-12-16 DIAGNOSIS — K580 Irritable bowel syndrome with diarrhea: Secondary | ICD-10-CM | POA: Diagnosis present

## 2022-12-16 DIAGNOSIS — K219 Gastro-esophageal reflux disease without esophagitis: Secondary | ICD-10-CM | POA: Diagnosis present

## 2022-12-16 DIAGNOSIS — S2242XA Multiple fractures of ribs, left side, initial encounter for closed fracture: Secondary | ICD-10-CM | POA: Diagnosis present

## 2022-12-16 DIAGNOSIS — W1830XA Fall on same level, unspecified, initial encounter: Secondary | ICD-10-CM | POA: Diagnosis present

## 2022-12-16 DIAGNOSIS — J3489 Other specified disorders of nose and nasal sinuses: Secondary | ICD-10-CM

## 2022-12-16 DIAGNOSIS — Z043 Encounter for examination and observation following other accident: Secondary | ICD-10-CM | POA: Diagnosis not present

## 2022-12-16 DIAGNOSIS — Z823 Family history of stroke: Secondary | ICD-10-CM

## 2022-12-16 DIAGNOSIS — R220 Localized swelling, mass and lump, head: Secondary | ICD-10-CM | POA: Diagnosis present

## 2022-12-16 DIAGNOSIS — Z79899 Other long term (current) drug therapy: Secondary | ICD-10-CM

## 2022-12-16 DIAGNOSIS — S2239XA Fracture of one rib, unspecified side, initial encounter for closed fracture: Secondary | ICD-10-CM

## 2022-12-16 DIAGNOSIS — F32A Depression, unspecified: Secondary | ICD-10-CM | POA: Diagnosis present

## 2022-12-16 DIAGNOSIS — Z91048 Other nonmedicinal substance allergy status: Secondary | ICD-10-CM

## 2022-12-16 DIAGNOSIS — M50322 Other cervical disc degeneration at C5-C6 level: Secondary | ICD-10-CM | POA: Diagnosis not present

## 2022-12-16 DIAGNOSIS — S3991XA Unspecified injury of abdomen, initial encounter: Secondary | ICD-10-CM | POA: Diagnosis not present

## 2022-12-16 DIAGNOSIS — E7800 Pure hypercholesterolemia, unspecified: Secondary | ICD-10-CM | POA: Diagnosis present

## 2022-12-16 DIAGNOSIS — Z801 Family history of malignant neoplasm of trachea, bronchus and lung: Secondary | ICD-10-CM

## 2022-12-16 DIAGNOSIS — R55 Syncope and collapse: Secondary | ICD-10-CM | POA: Diagnosis not present

## 2022-12-16 LAB — BASIC METABOLIC PANEL
Anion gap: 7 (ref 5–15)
BUN: 17 mg/dL (ref 8–23)
CO2: 25 mmol/L (ref 22–32)
Calcium: 8.8 mg/dL — ABNORMAL LOW (ref 8.9–10.3)
Chloride: 108 mmol/L (ref 98–111)
Creatinine, Ser: 0.94 mg/dL (ref 0.44–1.00)
GFR, Estimated: >60 mL/min (ref >60)
Glucose, Bld: 134 mg/dL — ABNORMAL HIGH (ref 70–99)
Potassium: 4.5 mmol/L (ref 3.5–5.1)
Sodium: 140 mmol/L (ref 135–145)

## 2022-12-16 LAB — URINALYSIS, ROUTINE W REFLEX MICROSCOPIC
Bilirubin Urine: NEGATIVE
Glucose, UA: NEGATIVE mg/dL
Ketones, ur: 5 mg/dL — AB
Leukocytes,Ua: NEGATIVE
Nitrite: NEGATIVE
Protein, ur: NEGATIVE mg/dL
Specific Gravity, Urine: 1.016 (ref 1.005–1.030)
pH: 6 (ref 5.0–8.0)

## 2022-12-16 LAB — CBC
HCT: 39.1 % (ref 36.0–46.0)
Hemoglobin: 12.5 g/dL (ref 12.0–15.0)
MCH: 30 pg (ref 26.0–34.0)
MCHC: 32 g/dL (ref 30.0–36.0)
MCV: 93.8 fL (ref 80.0–100.0)
Platelets: 215 10*3/uL (ref 150–400)
RBC: 4.17 MIL/uL (ref 3.87–5.11)
RDW: 16.8 % — ABNORMAL HIGH (ref 11.5–15.5)
WBC: 8.5 10*3/uL (ref 4.0–10.5)
nRBC: 0 % (ref 0.0–0.2)

## 2022-12-16 MED ORDER — ACETAMINOPHEN 650 MG RE SUPP: 650.0000 mg | RECTAL | Status: DC

## 2022-12-16 MED ORDER — ACETAMINOPHEN 325 MG PO TABS
650.0000 mg | ORAL_TABLET | ORAL | Status: DC
  Administered 2022-12-17 – 2022-12-18 (×9): 650 mg via ORAL
  Filled 2022-12-16 (×7): qty 2

## 2022-12-16 MED ORDER — INSULIN ASPART 100 UNIT/ML IJ SOLN: 0.0000 [IU] | Freq: Three times a day (TID) | INTRAMUSCULAR | Status: DC

## 2022-12-16 MED ORDER — SODIUM CHLORIDE 0.9% FLUSH
3.0000 mL | Freq: Two times a day (BID) | INTRAVENOUS | Status: DC
  Administered 2022-12-17 – 2022-12-18 (×2): 3 mL via INTRAVENOUS

## 2022-12-16 MED ORDER — IOHEXOL 300 MG/ML  SOLN
100.0000 mL | Freq: Once | INTRAMUSCULAR | Status: AC | PRN
  Administered 2022-12-16: 100 mL via INTRAVENOUS

## 2022-12-16 MED ORDER — MORPHINE SULFATE (PF) 4 MG/ML IV SOLN
4.0000 mg | Freq: Once | INTRAVENOUS | Status: AC
  Administered 2022-12-16: 4 mg via INTRAVENOUS
  Filled 2022-12-16: qty 1

## 2022-12-16 MED ORDER — INSULIN ASPART 100 UNIT/ML IJ SOLN: 0.0000 [IU] | Freq: Every day | INTRAMUSCULAR | Status: DC

## 2022-12-16 MED ORDER — ENOXAPARIN SODIUM 40 MG/0.4ML IJ SOSY
40.0000 mg | PREFILLED_SYRINGE | INTRAMUSCULAR | Status: DC
  Administered 2022-12-17 – 2022-12-18 (×2): 40 mg via SUBCUTANEOUS
  Filled 2022-12-16 (×2): qty 0.4

## 2022-12-16 MED ORDER — CELECOXIB 200 MG PO CAPS
200.0000 mg | ORAL_CAPSULE | Freq: Two times a day (BID) | ORAL | Status: DC
  Administered 2022-12-17 – 2022-12-18 (×4): 200 mg via ORAL
  Filled 2022-12-16 (×4): qty 1

## 2022-12-16 MED ORDER — FENTANYL CITRATE PF 50 MCG/ML IJ SOSY
50.0000 ug | PREFILLED_SYRINGE | Freq: Once | INTRAMUSCULAR | Status: AC
  Administered 2022-12-16: 50 ug via INTRAVENOUS
  Filled 2022-12-16: qty 1

## 2022-12-16 MED ORDER — OXYCODONE HCL 5 MG PO TABS
5.0000 mg | ORAL_TABLET | ORAL | Status: DC | PRN
  Administered 2022-12-17 – 2022-12-18 (×3): 5 mg via ORAL
  Filled 2022-12-16 (×3): qty 1

## 2022-12-16 NOTE — ED Notes (Signed)
EDP performed incentive spirometry teaching with pt. RN will reinforce.

## 2022-12-16 NOTE — ED Provider Notes (Signed)
[facility] Provider Note    Event Date/Time   First MD Initiated Contact with Patient 12/16/22 1450     (approximate)   History   Dizziness and Fall   HPI  Stephanie Powers is a 72 y.o. female presenting to the emergency department for evaluation after a fall.  Patient reports that she stood up suddenly from a chair and started walking, felt lightheaded and then passed out.  She reports that this has happened to her multiple times before.  Come provide a neighbor who confirms that this is not unusual for her.  She did fall onto a post that hit her left lower chest.  She reports significant pain over her left lower chest.  Otherwise denies chest pain.  Does report some mild abdominal pain.  Unsure if she hit her head when she fell, denies head, neck, back pain.  No numbness, tingling, weakness.    Physical Exam   Triage Vital Signs: ED Triage Vitals  Enc Vitals Group     BP 12/16/22 1354 (!) 151/71     Pulse Rate 12/16/22 1354 79     Resp 12/16/22 1354 20     Temp 12/16/22 1354 98.4 F (36.9 C)     Temp Source 12/16/22 1354 Oral     SpO2 12/16/22 1354 97 %     Weight 12/16/22 1342 112 lb (50.8 kg)     Height 12/16/22 1342 5' (1.524 m)     Head Circumference --      Peak Flow --      Pain Score 12/16/22 1342 8     Pain Loc --      Pain Edu? --      Excl. in GC? --     Most recent vital signs: Vitals:   12/16/22 1354 12/16/22 1916  BP: (!) 151/71 (!) 165/65  Pulse: 79 78  Resp: 20 19  Temp: 98.4 F (36.9 C) 99.2 F (37.3 C)  SpO2: 97% 97%    Nursing notes and vital signs reviewed.  General: Adult female, laying in bed, awake interactive Head: Atraumatic Chest: Symmetric chest rise, tenderness to palpation primarily along the left lower lateral chest.  Cardiac: Regular rhythm and rate.  Respiratory: Lungs clear to auscultation Abdomen: Soft, nondistended.  Tenderness to palpation throughout the abdomen more notably over the left upper  quadrant  Pelvis: Stable in AP and lateral compression. No tenderness to palpation. MSK: No deformity to bilateral upper and lower extremity. Full range of motion to bilateral upper lower extremity with no pain. Neuro: Alert, oriented. GCS 15. 5 out of 5 strength in bilateral upper and lower extremities. Normal sensation to light touch in bilateral upper and lower extremity. Skin: No evidence of burns or lacerations.  ED Results / Procedures / Treatments   Labs (all labs ordered are listed, but only abnormal results are displayed) Labs Reviewed  BASIC METABOLIC PANEL - Abnormal; Notable for the following components:      Result Value   Glucose, Bld 134 (*)    Calcium 8.8 (*)    All other components within normal limits  CBC - Abnormal; Notable for the following components:   RDW 16.8 (*)    All other components within normal limits  URINALYSIS, ROUTINE W REFLEX MICROSCOPIC - Abnormal; Notable for the following components:   Color, Urine YELLOW (*)    APPearance CLEAR (*)    Hgb urine dipstick SMALL (*)    Ketones, ur 5 (*)  Bacteria, UA MANY (*)    All other components within normal limits  HEMOGLOBIN A1C  CBC  CREATININE, SERUM  PROTIME-INR  APTT  BASIC METABOLIC PANEL  CBC  CBG MONITORING, ED     EKG EKG independently reviewed interpreted by myself (ER attending) demonstrates:  EKG demonstrates normal sinus rhythm at a rate of 80, PR 130, QRS 72, QTc 422, no acute ST changes  RADIOLOGY Imaging independently reviewed and interpreted by myself demonstrates:  Chest x-Gabrien Mentink demonstrates left-sided rib fractures Head CT without acute bleed CT C-spine without acute fracture CT abdomen pelvis without acute traumatic injury  PROCEDURES:  Critical Care performed: No  Procedures   MEDICATIONS ORDERED IN ED: Medications  oxyCODONE (Oxy IR/ROXICODONE) immediate release tablet 5 mg (has no administration in time range)  insulin aspart (novoLOG) injection 0-6 Units (has  no administration in time range)  insulin aspart (novoLOG) injection 0-5 Units ( Subcutaneous Not Given 12/16/22 2236)  enoxaparin (LOVENOX) injection 40 mg (has no administration in time range)  sodium chloride flush (NS) 0.9 % injection 3 mL (has no administration in time range)  acetaminophen (TYLENOL) tablet 650 mg (has no administration in time range)    Or  acetaminophen (TYLENOL) suppository 650 mg (has no administration in time range)  celecoxib (CELEBREX) capsule 200 mg (has no administration in time range)  fentaNYL (SUBLIMAZE) injection 50 mcg (50 mcg Intravenous Given 12/16/22 1648)  iohexol (OMNIPAQUE) 300 MG/ML solution 100 mL (100 mLs Intravenous Contrast Given 12/16/22 1728)  morphine (PF) 4 MG/ML injection 4 mg (4 mg Intravenous Given 12/16/22 2215)     IMPRESSION / MDM / ASSESSMENT AND PLAN / ED COURSE  I reviewed the triage vital signs and the nursing notes.  Differential diagnosis includes, but is not limited to, rib fracture, pneumothorax, intra-abdominal injury  Patient's presentation is most consistent with acute presentation with potential threat to life or bodily function.  72 year old female presenting following a syncopal episode with fall onto her left side.  EKG and labs overall reassuring.  CT does demonstrate acute displaced fractures of the left anterior seventh and eighth ribs.  Patient was given pain medicine and reevaluated.  She continues to have significant pain over her left side.  Incentive spirometer around 500.  I am concerned about her ability to take deep breaths.  With this, will discuss with hospitalist team for possible admission.  Case reviewed with hospitalist team.  They will evaluate the patient for anticipated admission.      FINAL CLINICAL IMPRESSION(S) / ED DIAGNOSES   Final diagnoses:  Syncope and collapse  Closed fracture of multiple ribs of left side, initial encounter     Rx / DC Orders   ED Discharge Orders     None         Note:  This document was prepared using Dragon voice recognition software and may include unintentional dictation errors.   Trinna Post, MD 12/16/22 [phone]

## 2022-12-16 NOTE — H&P (Signed)
History and Physical    Patient: Stephanie Powers YQM:[id number] DOB: 25-Feb-1951 DOA: 12/16/2022 DOS: the patient was seen and examined on 12/16/2022 PCP: Lonie Peak, PA-C  Patient coming from: Home  Chief Complaint:  Chief Complaint  Patient presents with   Dizziness   Fall   HPI: Stephanie Powers is a 72 y.o. female with medical history significant of irritable bowel syndrome with chronic on and off diarrhea with between 3-5 bowel movements a day.  Patient reports occasional lightheadedness when she gets up from a laying down position but this is not a persistent problem.  And has been going on for a couple of years.  Patient was in her usual state of health when she went to bed last evening (Actually only had about 3 bowel movements yesterday. ) and woke up at approximately 11 AM.   On getting up from her bed, patient reports an abrupt onset of "dizziness "that is felt to be presyncope on further clarification.  And patient sway to the left and hit her left nightstand.  With her left chest.  And lost consciousness.  There were no other people in the house and therefore there is no other witnesses available to corroborate.  Patient feels that she woke up after several minutes on the floor of the house.  There is no tongue injury no incontinence.  However patient has been having rather severe sharp left lower anterior chest pain since then that is worse with coughing laughing taking a deep breath. Patient had no preceding pulmonary artery symptoms such as palpitation, spinning sensation, fever, vomiting, extreme hunger, sweating or chest pain. Patient was able to get back up and contact family to advise them of the events.  Patient was subsequently brought to the ER by family.  ER course is notable for patient requiring IV opiates.  Otherwise vitally stable  Patient recalls having an episode of loss of consciousness approximately 2 years ago that was complicated by left wrist fracture.   Otherwise no loss of consciousness episodes since then     Review of Systems: As mentioned in the history of present illness. All other systems reviewed and are negative. Past Medical History:  Diagnosis Date   Arthritis    "hands, shoulders, feet, back" (04/10/2015)   Chronic lower back pain    COPD (chronic obstructive pulmonary disease) (HCC)    Depression    Dysrhythmia    "nothing to worry about" per MD   Family history of adverse reaction to anesthesia    Mother had stroke as she came out of anesthesia for back surgery.  (Blood pressure was high   Frequent sinus infections    GERD (gastroesophageal reflux disease)    Heart murmur    Hypercholesterolemia    Migraine    "once/month now" (04/10/2015)   Rash    "anywhere it wants to" chronic   Rash of neck    chronic   Shortness of breath dyspnea    with exertion   Spinal stenosis of lumbar region    Past Surgical History:  Procedure Laterality Date   25 GAUGE PARS PLANA VITRECTOMY WITH 20 GAUGE MVR PORT Right 04/10/2015   25 GAUGE PARS PLANA VITRECTOMY WITH 20 GAUGE MVR PORT Right 04/10/2015   Procedure: 25 GAUGE PARS PLANA VITRECTOMY WITH 20 GAUGE MVR PORT;  Surgeon: Sherrie George, MD;  Location: [facility] OR;  Service: Ophthalmology;  Laterality: Right;   ABDOMINAL HYSTERECTOMY     "still have my left ovary"  CARPAL TUNNEL RELEASE Bilateral    COLONOSCOPY WITH PROPOFOL N/A 07/06/2015   Procedure: COLONOSCOPY WITH PROPOFOL;  Surgeon: Christena Deem, MD;  Location: [facility] ENDOSCOPY;  Service: Endoscopy;  Laterality: N/A;   ESOPHAGOGASTRODUODENOSCOPY (EGD) WITH PROPOFOL N/A 07/06/2015   Procedure: ESOPHAGOGASTRODUODENOSCOPY (EGD) WITH PROPOFOL;  Surgeon: Christena Deem, MD;  Location: [facility] ENDOSCOPY;  Service: Endoscopy;  Laterality: N/A;   ESOPHAGOGASTRODUODENOSCOPY (EGD) WITH PROPOFOL N/A 03/17/2017   Procedure: ESOPHAGOGASTRODUODENOSCOPY (EGD) WITH PROPOFOL;  Surgeon: Christena Deem, MD;  Location: [facility] ENDOSCOPY;   Service: Endoscopy;  Laterality: N/A;   EYE SURGERY     FRACTURE SURGERY     GAS/FLUID EXCHANGE Right 04/10/2015   Procedure: GAS/FLUID EXCHANGE;  Surgeon: Sherrie George, MD;  Location: [facility] OR;  Service: Ophthalmology;  Laterality: Right;   LASER PHOTO ABLATION Right 04/10/2015   Procedure: LASER PHOTO ABLATION;  Surgeon: Sherrie George, MD;  Location: [facility] OR;  Service: Ophthalmology;  Laterality: Right;  Head scope laser   MEMBRANE PEEL Right 04/10/2015   Procedure: MEMBRANE PEEL;  Surgeon: Sherrie George, MD;  Location: [facility] OR;  Service: Ophthalmology;  Laterality: Right;   NASAL SINUS SURGERY     WRIST FRACTURE SURGERY Right    Social History:  reports that she has been smoking cigarettes. She has a 40.00 pack-year smoking history. She has never used smokeless tobacco. She reports that she does not drink alcohol and does not use drugs.  Allergies  Allergen Reactions   Other Other (See Comments)    Allergic to makeup- rash-  Perfume, Hairspray- breathing difficulty and swelling    Family History  Problem Relation Age of Onset   Stroke Mother    Cancer - Lung Father     Prior to Admission medications   Medication Sig Start Date End Date Taking? Authorizing Provider  albuterol (PROVENTIL HFA;VENTOLIN HFA) 108 (90 BASE) MCG/ACT inhaler Inhale 1-2 puffs into the lungs every 6 (six) hours as needed for wheezing or shortness of breath.    [provider]  atorvastatin (LIPITOR) 40 MG tablet Take 40 mg by mouth every morning.    [provider]  cetirizine (ZYRTEC) 10 MG tablet Take 20 mg by mouth daily.     [provider]  dicyclomine (BENTYL) 10 MG capsule Take 20 mg by mouth 2 (two) times daily before a meal. 03/02/17   [provider]  HYDROcodone-acetaminophen (NORCO/VICODIN) 5-325 MG tablet Take 1 tablet by mouth every 4 (four) hours as needed. 07/06/17   Bethel Born, PA-C  [facility] ARTHRITIS PAIN RELIEF) PADS Apply 1 each  topically daily as needed (pain).     [provider]  pantoprazole (PROTONIX) 40 MG tablet Take 40 mg by mouth 2 (two) times daily with a meal. 02/19/17   [provider]  Polyvinyl Alcohol-Povidone (REFRESH OP) Place 1 drop into both eyes every 4 (four) hours as needed (dry eyes).    [provider]  sertraline (ZOLOFT) 100 MG tablet Take 150 mg by mouth daily.     [provider]  traZODone (DESYREL) 100 MG tablet Take 100 mg by mouth at bedtime. 02/12/17   [provider]  triamcinolone cream (KENALOG) 0.1 % Apply 1 application topically at bedtime as needed (rash).     [provider]    Physical Exam: Vitals:   12/16/22 1342 12/16/22 1354 12/16/22 1916  BP:  (!) 151/71 (!) 165/65  Pulse:  79 78  Resp:  20 19  Temp:  98.4 F (36.9 C) 99.2 F (37.3 C)  TempSrc:  Oral Oral  SpO2:  97% 97%  Weight: 50.8 kg    Height: 5' (1.524 m)     General patient does not have any physiologic distress apparent, occasionally gets mild to moderate painful distress. Respiratory exam: Bilateral air entry vesicular, direct tenderness to left lower anterolateral chest wall Cardiovascular exam S1-S2 normal Neurologic alert awake oriented x 3 gives a coherent account of her history today no focal motor deficit no dysmetria on any extremity.  Gazes are smooth pupil equal round.  No nystagmus Symmetric facies No flail chest Skin no rash Extremities warm without edema No tongue injury. Data Reviewed:  Labs on Admission:  Results for orders placed or performed during the hospital encounter of 12/16/22 (from the past 24 hour(s))  Basic metabolic panel     Status: Abnormal   Collection Time: 12/16/22  1:53 PM  Result Value Ref Range   Sodium 140 135 - 145 mmol/L   Potassium 4.5 3.5 - 5.1 mmol/L   Chloride 108 98 - 111 mmol/L   CO2 25 22 - 32 mmol/L   Glucose, Bld 134 (H) 70 - 99 mg/dL   BUN 17 8 - 23 mg/dL   Creatinine, Ser 1.61 0.44 - 1.00 mg/dL    Calcium 8.8 (L) 8.9 - 10.3 mg/dL   GFR, Estimated >09 >60 mL/min   Anion gap 7 5 - 15  CBC     Status: Abnormal   Collection Time: 12/16/22  1:53 PM  Result Value Ref Range   WBC 8.5 4.0 - 10.5 K/uL   RBC 4.17 3.87 - 5.11 MIL/uL   Hemoglobin 12.5 12.0 - 15.0 g/dL   HCT 45.4 09.8 - 11.9 %   MCV 93.8 80.0 - 100.0 fL   MCH 30.0 26.0 - 34.0 pg   MCHC 32.0 30.0 - 36.0 g/dL   RDW 14.7 (H) 82.9 - 56.2 %   Platelets 215 150 - 400 K/uL   nRBC 0.0 0.0 - 0.2 %  Urinalysis, Routine w reflex microscopic -Urine, Clean Catch     Status: Abnormal   Collection Time: 12/16/22  5:21 PM  Result Value Ref Range   Color, Urine YELLOW (A) YELLOW   APPearance CLEAR (A) CLEAR   Specific Gravity, Urine 1.016 1.005 - 1.030   pH 6.0 5.0 - 8.0   Glucose, UA NEGATIVE NEGATIVE mg/dL   Hgb urine dipstick SMALL (A) NEGATIVE   Bilirubin Urine NEGATIVE NEGATIVE   Ketones, ur 5 (A) NEGATIVE mg/dL   Protein, ur NEGATIVE NEGATIVE mg/dL   Nitrite NEGATIVE NEGATIVE   Leukocytes,Ua NEGATIVE NEGATIVE   RBC / HPF 0-5 0 - 5 RBC/hpf   WBC, UA 0-5 0 - 5 WBC/hpf   Bacteria, UA MANY (A) NONE SEEN   Squamous Epithelial / HPF 0-5 0 - 5 /HPF   Mucus PRESENT    Basic Metabolic Panel: Recent Labs  Lab 12/16/22 1353  NA 140  K 4.5  CL 108  CO2 25  GLUCOSE 134*  BUN 17  CREATININE 0.94  CALCIUM 8.8*   Liver Function Tests: No results for input(s): "AST", "ALT", "ALKPHOS", "BILITOT", "PROT", "ALBUMIN" in the last 168 hours. No results for input(s): "LIPASE", "AMYLASE" in the last 168 hours. No results for input(s): "AMMONIA" in the last 168 hours. CBC: Recent Labs  Lab 12/16/22 1353  WBC 8.5  HGB 12.5  HCT 39.1  MCV 93.8  PLT 215   Cardiac Enzymes: No results for input(s): "  CKTOTAL", "CKMB", "CKMBINDEX", "TROPONINIHS" in the last 168 hours.  BNP (last 3 results) No results for input(s): "PROBNP" in the last 8760 hours. CBG: No results for input(s): "GLUCAP" in the last 168 hours.  Radiological  Exams on Admission:  CT Head Wo Contrast  Result Date: 12/16/2022 CLINICAL DATA:  Fall EXAM: CT HEAD WITHOUT CONTRAST CT CERVICAL SPINE WITHOUT CONTRAST TECHNIQUE: Multidetector CT imaging of the head and cervical spine was performed following the standard protocol without intravenous contrast. Multiplanar CT image reconstructions of the cervical spine were also generated. RADIATION DOSE REDUCTION: This exam was performed according to the departmental dose-optimization program which includes automated exposure control, adjustment of the mA and/or kV according to patient size and/or use of iterative reconstruction technique. COMPARISON:  CT 12/18/2006, facial CT 09/09/2010 FINDINGS: CT HEAD FINDINGS Brain: No acute territorial infarction, hemorrhage, or intracranial mass is seen. Patchy white matter hypodensity consistent with chronic small vessel ischemic change. Vascular: No hyperdense vessels. Vertebral and carotid vascular calcification. Skull: No fracture. Sinuses/Orbits: Expansile mass in the right ethmoid sinus measuring approximately 2.3 by 1.7 cm with protrusion into the right orbit, mass touches the medial extraocular muscle. Small fluid level right maxillary sinus. Other: None CT CERVICAL SPINE FINDINGS Alignment: Trace anterolisthesis C7 on T1. Facet alignment is within normal limits. Skull base and vertebrae: No acute fracture. No primary bone lesion or focal pathologic process. Soft tissues and spinal canal: No prevertebral fluid or swelling. No visible canal hematoma. Disc levels: Multilevel degenerative change. Moderate severe degenerative changes C5-C6 and C6-C7. Facet degenerative changes at multiple levels with foraminal narrowing. Upper chest: Emphysema. Other: None IMPRESSION: 1. No CT evidence for acute intracranial abnormality. Mild chronic small vessel ischemic changes of the white matter. 2. Expansile mass in the right ethmoid sinus with protrusion into the right orbit. Findings could be  secondary to mucocele or other sinus tumor. Recommend nonemergent MRI for further evaluation as well as ENT consultation. 3. Multilevel degenerative changes of the cervical spine. No acute osseous abnormality. 4. Emphysema. Emphysema (ICD10-J43.9). Electronically Signed   By: Jasmine Pang M.D.   On: 12/16/2022 18:20   CT Cervical Spine Wo Contrast  Result Date: 12/16/2022 CLINICAL DATA:  Fall EXAM: CT HEAD WITHOUT CONTRAST CT CERVICAL SPINE WITHOUT CONTRAST TECHNIQUE: Multidetector CT imaging of the head and cervical spine was performed following the standard protocol without intravenous contrast. Multiplanar CT image reconstructions of the cervical spine were also generated. RADIATION DOSE REDUCTION: This exam was performed according to the departmental dose-optimization program which includes automated exposure control, adjustment of the mA and/or kV according to patient size and/or use of iterative reconstruction technique. COMPARISON:  CT 12/18/2006, facial CT 09/09/2010 FINDINGS: CT HEAD FINDINGS Brain: No acute territorial infarction, hemorrhage, or intracranial mass is seen. Patchy white matter hypodensity consistent with chronic small vessel ischemic change. Vascular: No hyperdense vessels. Vertebral and carotid vascular calcification. Skull: No fracture. Sinuses/Orbits: Expansile mass in the right ethmoid sinus measuring approximately 2.3 by 1.7 cm with protrusion into the right orbit, mass touches the medial extraocular muscle. Small fluid level right maxillary sinus. Other: None CT CERVICAL SPINE FINDINGS Alignment: Trace anterolisthesis C7 on T1. Facet alignment is within normal limits. Skull base and vertebrae: No acute fracture. No primary bone lesion or focal pathologic process. Soft tissues and spinal canal: No prevertebral fluid or swelling. No visible canal hematoma. Disc levels: Multilevel degenerative change. Moderate severe degenerative changes C5-C6 and C6-C7. Facet degenerative changes at  multiple levels with foraminal narrowing. Upper chest:  Emphysema. Other: None IMPRESSION: 1. No CT evidence for acute intracranial abnormality. Mild chronic small vessel ischemic changes of the white matter. 2. Expansile mass in the right ethmoid sinus with protrusion into the right orbit. Findings could be secondary to mucocele or other sinus tumor. Recommend nonemergent MRI for further evaluation as well as ENT consultation. 3. Multilevel degenerative changes of the cervical spine. No acute osseous abnormality. 4. Emphysema. Emphysema (ICD10-J43.9). Electronically Signed   By: Jasmine Pang M.D.   On: 12/16/2022 18:20   CT CHEST ABDOMEN PELVIS W CONTRAST  Result Date: 12/16/2022 CLINICAL DATA:  Fall with blunt trauma. X-ray shows multiple rib fractures EXAM: CT CHEST, ABDOMEN, AND PELVIS WITH CONTRAST TECHNIQUE: Multidetector CT imaging of the chest, abdomen and pelvis was performed following the standard protocol during bolus administration of intravenous contrast. RADIATION DOSE REDUCTION: This exam was performed according to the departmental dose-optimization program which includes automated exposure control, adjustment of the mA and/or kV according to patient size and/or use of iterative reconstruction technique. CONTRAST:  OMNIPAQUE IOHEXOL 300 MG/ML  SOLN COMPARISON:  Radiographs 12/16/2022; CT chest 03/05/2022 and CT abdomen and pelvis 10/11/2021 FINDINGS: CT CHEST FINDINGS Cardiovascular: No pericardial effusion. Advanced aortic atherosclerotic calcification. No evidence of acute aortic injury. Coronary artery calcification. Mediastinum/Nodes: Unremarkable esophagus. No thoracic adenopathy. No mediastinal hematoma. Lungs/Pleura: Mild emphysema in the apices. No focal consolidation. No pleural effusion or pneumothorax. Adherent secretions in the trachea extending into the right mainstem bronchus. Musculoskeletal: Advanced arthritis right shoulder. Acute displaced fractures of the left anterior  seventh-eighth ribs. CT ABDOMEN PELVIS FINDINGS Hepatobiliary: No hepatic injury or perihepatic hematoma. Gallbladder is unremarkable. Pancreas: Unremarkable. No pancreatic ductal dilatation or surrounding inflammatory changes. Spleen: No splenic injury or perisplenic hematoma. Adrenals/Urinary Tract: No adrenal hemorrhage or renal injury identified. Bladder is unremarkable. Stomach/Bowel: Normal caliber large and small bowel. Colonic diverticulosis without diverticulitis. Stomach is within normal limits. Normal appendix. Vascular/Lymphatic: Aortic atherosclerosis. No enlarged abdominal or pelvic lymph nodes. Reproductive: Status post hysterectomy. No adnexal masses. Other: No free intraperitoneal fluid or air. Musculoskeletal: Thoracolumbar spondylosis. Posterior fusion L4-L5. Advanced disc space height loss and degenerative endplate changes at L5-S1. No acute fracture. IMPRESSION: 1. Acute displaced fractures of the left anterior 7th-8th ribs. No pneumothorax. 2. No evidence of acute traumatic injury within the abdomen or pelvis. Aortic Atherosclerosis (ICD10-I70.0) and Emphysema (ICD10-J43.9). Electronically Signed   By: Minerva Fester M.D.   On: 12/16/2022 18:17   DG Ribs Unilateral W/Chest Left  Result Date: 12/16/2022 CLINICAL DATA:  Trauma, fall EXAM: LEFT RIBS AND CHEST - 3+ VIEW COMPARISON:  Chest radiograph done on 03/16/2017 FINDINGS: Cardiac size is within normal limits. Thoracic aorta is tortuous. There are no focal pulmonary infiltrates. There is no pleural effusion or pneumothorax. Displaced recent fracture is seen in the anterior left seventh and eighth ribs. Deformities are seen in left fourth, fifth and sixth ribs, possibly residual from previous injury. There is surgical fusion in lower lumbar spine. Degenerative changes are noted in both shoulders, more severe in the right shoulder. IMPRESSION: Displaced recent fractures are seen in the anterior left seventh and eighth ribs. Deformity seen  in the left fourth, fifth and sixth ribs without fracture line may suggest old healed fractures. There is no focal pulmonary consolidation. There is no pleural effusion or pneumothorax. Electronically Signed   By: Ernie Avena M.D.   On: 12/16/2022 14:22    EKG: Independently reviewed.  Sinus rhythm, no QRS abnormalities noted.  No slurred upstroke of QRS  complex.   Assessment and Plan: * Syncope At approximately 11 AM when getting up from bed this morning.  I will maintain the patient on telemetry, check echo, check lower extremity Dopplers.  Monitor blood glucose. EKG non actionable.  Rib fracture Of the left anterior seventh and eighth rib.  Acute.  Due to direct trauma.  I will order Celebrex and acetaminophen standing.  With oxycodone as needed for moderate pain.  We will monitor response and escalate therapy if needed.  Maintain patient on telemetry tonight.  First dose of opiates being given in the ER, if tolerated, and no escalation needed, will maintain patient off of pulse oximetry  Mass of nasal sinus 's note CAT scan head done on Dec 16, 2022.  Expansile mass in the right ethmoid sinus with protrusion into the right orbit.  This will need routine ENT evaluation.      Advance Care Planning:   Code Status: Full Code   Consults: none  Family Communication: per patient.  Severity of Illness: The appropriate patient status for this patient is OBSERVATION. Observation status is judged to be reasonable and necessary in order to provide the required intensity of service to ensure the patient's safety. The patient's presenting symptoms, physical exam findings, and initial radiographic and laboratory data in the context of their medical condition is felt to place them at decreased risk for further clinical deterioration. Furthermore, it is anticipated that the patient will be medically stable for discharge from the hospital within 2 midnights of admission.   Author: Nolberto Hanlon,  MD 12/16/2022 10:04 PM  For on call review [email or web address].

## 2022-12-16 NOTE — ED Notes (Signed)
Pt ambulated to restroom without assistance.

## 2022-12-16 NOTE — ED Notes (Signed)
Pt given boxed lunch and drink. Teaching performed on splinting chest and importance in taking deep inspirations.

## 2022-12-16 NOTE — Assessment & Plan Note (Signed)
'  s note CAT scan head done on Dec 16, 2022.  Expansile mass in the right ethmoid sinus with protrusion into the right orbit.  This will need routine ENT evaluation.

## 2022-12-16 NOTE — Assessment & Plan Note (Addendum)
At approximately 11 AM when getting up from bed this morning.  I will maintain the patient on telemetry, check echo, check lower extremity Dopplers.  Monitor blood glucose. EKG non actionable.

## 2022-12-16 NOTE — ED Notes (Signed)
PT ambulated to restroom with 1 person assist.

## 2022-12-16 NOTE — Assessment & Plan Note (Addendum)
Of the left anterior seventh and eighth rib.  Acute.  Due to direct trauma.  I will order Celebrex and acetaminophen standing.  With oxycodone as needed for moderate pain.  We will monitor response and escalate therapy if needed.  Maintain patient on telemetry tonight.  First dose of opiates being given in the ER, if tolerated, and no escalation needed, will maintain patient off of continous pulse oximetry

## 2022-12-17 ENCOUNTER — Observation Stay
Admit: 2022-12-17 | Discharge: 2022-12-17 | Disposition: A | Discharge status: Home / Self Care | Payer: No Typology Code available for payment source | Attending: Internal Medicine

## 2022-12-17 ENCOUNTER — Observation Stay: Payer: No Typology Code available for payment source

## 2022-12-17 DIAGNOSIS — J3489 Other specified disorders of nose and nasal sinuses: Secondary | ICD-10-CM | POA: Diagnosis not present

## 2022-12-17 DIAGNOSIS — Z79899 Other long term (current) drug therapy: Secondary | ICD-10-CM | POA: Diagnosis not present

## 2022-12-17 DIAGNOSIS — W1830XA Fall on same level, unspecified, initial encounter: Secondary | ICD-10-CM | POA: Diagnosis present

## 2022-12-17 DIAGNOSIS — Z91048 Other nonmedicinal substance allergy status: Secondary | ICD-10-CM | POA: Diagnosis not present

## 2022-12-17 DIAGNOSIS — E7800 Pure hypercholesterolemia, unspecified: Secondary | ICD-10-CM | POA: Diagnosis not present

## 2022-12-17 DIAGNOSIS — S2242XA Multiple fractures of ribs, left side, initial encounter for closed fracture: Secondary | ICD-10-CM | POA: Diagnosis not present

## 2022-12-17 DIAGNOSIS — Z9981 Dependence on supplemental oxygen: Secondary | ICD-10-CM | POA: Diagnosis not present

## 2022-12-17 DIAGNOSIS — R55 Syncope and collapse: Secondary | ICD-10-CM

## 2022-12-17 DIAGNOSIS — F17210 Nicotine dependence, cigarettes, uncomplicated: Secondary | ICD-10-CM | POA: Diagnosis not present

## 2022-12-17 DIAGNOSIS — Z823 Family history of stroke: Secondary | ICD-10-CM | POA: Diagnosis not present

## 2022-12-17 DIAGNOSIS — K580 Irritable bowel syndrome with diarrhea: Secondary | ICD-10-CM | POA: Diagnosis not present

## 2022-12-17 DIAGNOSIS — F32A Depression, unspecified: Secondary | ICD-10-CM | POA: Diagnosis not present

## 2022-12-17 DIAGNOSIS — M7989 Other specified soft tissue disorders: Secondary | ICD-10-CM | POA: Diagnosis not present

## 2022-12-17 DIAGNOSIS — Z801 Family history of malignant neoplasm of trachea, bronchus and lung: Secondary | ICD-10-CM | POA: Diagnosis not present

## 2022-12-17 DIAGNOSIS — R220 Localized swelling, mass and lump, head: Secondary | ICD-10-CM | POA: Diagnosis not present

## 2022-12-17 DIAGNOSIS — J449 Chronic obstructive pulmonary disease, unspecified: Secondary | ICD-10-CM | POA: Diagnosis not present

## 2022-12-17 DIAGNOSIS — K219 Gastro-esophageal reflux disease without esophagitis: Secondary | ICD-10-CM | POA: Diagnosis not present

## 2022-12-17 LAB — ECHOCARDIOGRAM COMPLETE
AR max vel: 2.28 cm2
AV Area VTI: 2.55 cm2
AV Area mean vel: 2.28 cm2
AV Mean grad: 7 mmHg
AV Peak grad: 14.3 mmHg
Ao pk vel: 1.89 m/s
Area-P 1/2: 3.21 cm2
Height: 60 in
MV VTI: 3.57 cm2
S' Lateral: 1.7 cm
Weight: 1792 oz

## 2022-12-17 LAB — BASIC METABOLIC PANEL
Anion gap: 7 (ref 5–15)
BUN: 13 mg/dL (ref 8–23)
CO2: 21 mmol/L — ABNORMAL LOW (ref 22–32)
Calcium: 8.1 mg/dL — ABNORMAL LOW (ref 8.9–10.3)
Chloride: 107 mmol/L (ref 98–111)
Creatinine, Ser: 0.72 mg/dL (ref 0.44–1.00)
GFR, Estimated: >60 mL/min (ref >60)
Glucose, Bld: 101 mg/dL — ABNORMAL HIGH (ref 70–99)
Potassium: 3.7 mmol/L (ref 3.5–5.1)
Sodium: 135 mmol/L (ref 135–145)

## 2022-12-17 LAB — PROTIME-INR
INR: 1 (ref 0.8–1.2)
Prothrombin Time: 13.9 seconds (ref 11.4–15.2)

## 2022-12-17 LAB — GLUCOSE, CAPILLARY
Glucose-Capillary: 107 mg/dL — ABNORMAL HIGH (ref 70–99)
Glucose-Capillary: 140 mg/dL — ABNORMAL HIGH (ref 70–99)

## 2022-12-17 LAB — CBG MONITORING, ED
Glucose-Capillary: 106 mg/dL — ABNORMAL HIGH (ref 70–99)
Glucose-Capillary: 113 mg/dL — ABNORMAL HIGH (ref 70–99)

## 2022-12-17 LAB — CBC
HCT: 37.8 % (ref 36.0–46.0)
Hemoglobin: 11.6 g/dL — ABNORMAL LOW (ref 12.0–15.0)
MCH: 29.8 pg (ref 26.0–34.0)
MCHC: 30.7 g/dL (ref 30.0–36.0)
MCV: 97.2 fL (ref 80.0–100.0)
Platelets: 180 10*3/uL (ref 150–400)
RBC: 3.89 MIL/uL (ref 3.87–5.11)
RDW: 16.7 % — ABNORMAL HIGH (ref 11.5–15.5)
WBC: 9 10*3/uL (ref 4.0–10.5)
nRBC: 0 % (ref 0.0–0.2)

## 2022-12-17 LAB — HEMOGLOBIN A1C
Hgb A1c MFr Bld: 5.7 % — ABNORMAL HIGH (ref 4.8–5.6)
Mean Plasma Glucose: 116.89 mg/dL

## 2022-12-17 LAB — APTT: aPTT: 28 seconds (ref 24–36)

## 2022-12-17 MED ORDER — SERTRALINE HCL 50 MG PO TABS
150.0000 mg | ORAL_TABLET | Freq: Every day | ORAL | Status: DC
  Administered 2022-12-17 – 2022-12-18 (×2): 150 mg via ORAL
  Filled 2022-12-17 (×2): qty 3

## 2022-12-17 MED ORDER — LORATADINE 10 MG PO TABS
10.0000 mg | ORAL_TABLET | Freq: Every day | ORAL | Status: DC
  Administered 2022-12-17 – 2022-12-18 (×2): 10 mg via ORAL
  Filled 2022-12-17 (×2): qty 1

## 2022-12-17 MED ORDER — HYDROXYZINE HCL 10 MG PO TABS
10.0000 mg | ORAL_TABLET | Freq: Four times a day (QID) | ORAL | Status: DC | PRN
  Administered 2022-12-18: 10 mg via ORAL
  Filled 2022-12-17 (×2): qty 1

## 2022-12-17 MED ORDER — UMECLIDINIUM BROMIDE 62.5 MCG/ACT IN AEPB
1.0000 | INHALATION_SPRAY | Freq: Every day | RESPIRATORY_TRACT | Status: DC
  Administered 2022-12-17 – 2022-12-18 (×2): 1 via RESPIRATORY_TRACT
  Filled 2022-12-17: qty 7

## 2022-12-17 MED ORDER — POTASSIUM CHLORIDE 10 MEQ/100ML IV SOLN: 10.0000 meq | INTRAVENOUS | Status: DC

## 2022-12-17 MED ORDER — FLUTICASONE FUROATE-VILANTEROL 100-25 MCG/ACT IN AEPB
1.0000 | INHALATION_SPRAY | Freq: Every day | RESPIRATORY_TRACT | Status: DC
  Administered 2022-12-17 – 2022-12-18 (×2): 1 via RESPIRATORY_TRACT
  Filled 2022-12-17: qty 28

## 2022-12-17 MED ORDER — POLYVINYL ALCOHOL 1.4 % OP SOLN: 2.0000 [drp] | OPHTHALMIC | Status: DC | PRN

## 2022-12-17 MED ORDER — SODIUM CHLORIDE 0.9 % IV SOLN: INTRAVENOUS | Status: DC

## 2022-12-17 MED ORDER — ALBUTEROL SULFATE (2.5 MG/3ML) 0.083% IN NEBU: 2.5000 mg | INHALATION_SOLUTION | Freq: Four times a day (QID) | RESPIRATORY_TRACT | Status: DC | PRN

## 2022-12-17 MED ORDER — TRAZODONE HCL 50 MG PO TABS
100.0000 mg | ORAL_TABLET | Freq: Every day | ORAL | Status: DC
  Administered 2022-12-17: 100 mg via ORAL
  Filled 2022-12-17: qty 2

## 2022-12-17 MED ORDER — NICOTINE 21 MG/24HR TD PT24
21.0000 mg | MEDICATED_PATCH | Freq: Every day | TRANSDERMAL | Status: DC
  Administered 2022-12-17 – 2022-12-18 (×2): 21 mg via TRANSDERMAL
  Filled 2022-12-17 (×3): qty 1

## 2022-12-17 MED ORDER — FLUTICASONE PROPIONATE 50 MCG/ACT NA SUSP
1.0000 | Freq: Every day | NASAL | Status: DC
  Administered 2022-12-17 – 2022-12-18 (×2): 1 via NASAL
  Filled 2022-12-17: qty 16

## 2022-12-17 MED ORDER — ATORVASTATIN CALCIUM 20 MG PO TABS
40.0000 mg | ORAL_TABLET | Freq: Every morning | ORAL | Status: DC
  Administered 2022-12-17 – 2022-12-18 (×2): 40 mg via ORAL
  Filled 2022-12-17 (×2): qty 2

## 2022-12-17 MED ORDER — PANTOPRAZOLE SODIUM 40 MG PO TBEC
40.0000 mg | DELAYED_RELEASE_TABLET | Freq: Two times a day (BID) | ORAL | Status: DC
  Administered 2022-12-17 – 2022-12-18 (×3): 40 mg via ORAL
  Filled 2022-12-17 (×3): qty 1

## 2022-12-17 NOTE — ED Notes (Signed)
Ambulated pt to restroom  

## 2022-12-17 NOTE — ED Notes (Signed)
Patient c/o of increasing shortness of breath while lying in bed. Placed patient on 2L of O2 via [place].

## 2022-12-17 NOTE — ED Notes (Signed)
   12/17/22 1041  Orthostatic Sitting  BP- Sitting 154/90  Orthostatic Standing at 0 minutes  BP- Standing at 0 minutes 137/87  Orthostatic Standing at 3 minutes  BP- Standing at 3 minutes 137/73   The above vitals performed with PT. Pt confirmed feeling dizzy and weak. Pt visibly SOB with ambulation. Placed on 2 L and stabilized at 92% O2.

## 2022-12-17 NOTE — Progress Notes (Signed)
*  PRELIMINARY RESULTS* Echocardiogram 2D Echocardiogram has been performed.  Stephanie Powers 12/17/2022, 9:59 AM

## 2022-12-17 NOTE — Progress Notes (Signed)
PROGRESS NOTE  [name]    DOB: 05/07/1951, 72 y.o.  VHQ:[id number]    Code Status: Full Code   DOA: 12/16/2022   LOS: 0   Brief hospital course  Stephanie Powers is a 72 y.o. female with a PMH significant for  irritable bowel syndrome with chronic on and off diarrhea with between 3-5 bowel movements a day, arthritis, COPD without chronic oxygen use, depression, GERD, HLD.  They presented from home to the ED on 12/16/2022 with syncopal episode which was unwitnessed. Patient describes getting out of bed and falling to floor with LOC. She woke up unknown time later and was able to get herself up from the floor. Fall was proceeded by dizziness upon standing from bed.   In the ED, it was found that they had stable vital signs on room air.  Significant findings included normal metabolic panel, CBC . Hgb A1c 5.7. urinalysis small hgb with ketones and many bacteria. Xray and CT chest revealed anterior left 7-8 displaced rib fractures.  Head CT negative for acute intracranial abnormality. Was positive for Expansile mass in the right ethmoid sinus with protrusion into the right orbit. Findings could be secondary to mucocele or other sinus tumor. Recommend nonemergent MRI for further evaluation as well as ENT consultation.  They were initially treated with analgesics. Transiently placed on 2L O2.    Patient was admitted to medicine service for further workup and management of syncope as outlined in detail below.  12/17/22 -stable, remains symptomatic with standing but no more syncopal episodes.   Assessment & Plan  Principal Problem:   Syncope Active Problems:   Syncope and collapse   Mass of nasal sinus   Rib fracture  Syncope- workup is unremarkable including telemetry, CT imaging, echo, dopplers, labs for cause of syncope. Patient has positive orthostatic vitals and is symptomatic with standing. Reviewed home meds and only obvious contributor possible is trazodone for sedation  contribution. Signs of dehydration can be contributing.  - continue IV fluid hydration.  - repeat orthostatics   Rib fracture- Of the left anterior seventh and eighth rib.   - analgesia PRN - incentive spirometer to avoid atelectasis    Mass of nasal sinus- seen on CT- Expansile mass in the right ethmoid sinus with protrusion into the right orbit.  This will need routine ENT evaluation. And non-emergent MRI  Nicotine dependence- smokes pack per day.  - provided counseling - nicotine replacement therapy  Body mass index is 21.87 kg/m.  VTE ppx: enoxaparin (LOVENOX) injection 40 mg Start: 12/17/22 0900 SCDs Start: 12/16/22 2205  Diet:     Diet   Diet regular Room service appropriate? Yes; Fluid consistency: Thin   Consultants: None   Subjective 12/17/22    Pt reports feeling asymptomatic at rest. Feels dizziness with standing or positional. Denies N/V. Denies urinary symptoms. Continues to have pain of ribs.    Objective   Vitals:   12/16/22 1916 12/17/22 0221 12/17/22 1023 12/17/22 1512  BP: (!) 165/65 124/66 (!) 147/79 (!) 152/87  Pulse: 78 77 82 84  Resp: 19 19 20    Temp: 99.2 F (37.3 C)  97.9 F (36.6 C)   TempSrc: Oral  Oral   SpO2: 97% 95% 92% 98%  Weight:      Height:       No intake or output data in the 24 hours ending 12/17/22 1536 Filed Weights   12/16/22 1342  Weight: 50.8 kg    Physical Exam:  General:  awake, alert, NAD HEENT: atraumatic, clear conjunctiva, anicteric sclera, MMM, hearing grossly normal Respiratory: normal respiratory effort. CTAB Cardiovascular: quick capillary refill, normal S1/S2, RRR, no JVD, murmurs Nervous: A&O x3. no gross focal neurologic deficits, normal speech Extremities: moves all equally, no edema, normal tone Skin: dry, intact, normal temperature, normal color. No rashes, lesions or ulcers on exposed skin Psychiatry: normal mood, congruent affect  Labs   I have personally reviewed the following labs and imaging  studies CBC    Component Value Date/Time   WBC 9.0 12/17/2022 0545   RBC 3.89 12/17/2022 0545   HGB 11.6 (L) 12/17/2022 0545   HCT 37.8 12/17/2022 0545   PLT 180 12/17/2022 0545   MCV 97.2 12/17/2022 0545   MCH 29.8 12/17/2022 0545   MCHC 30.7 12/17/2022 0545   RDW 16.7 (H) 12/17/2022 0545   LYMPHSABS 2.3 03/16/2017 1524   MONOABS 0.4 03/16/2017 1524   EOSABS 0.1 03/16/2017 1524   BASOSABS 0.0 03/16/2017 1524      Latest Ref Rng & Units 12/17/2022    5:45 AM 12/16/2022    1:53 PM 03/16/2017    3:24 PM  BMP  Glucose 70 - 99 mg/dL 295  621  89   BUN 8 - 23 mg/dL 13  17  9    Creatinine 0.44 - 1.00 mg/dL 3.08  6.57  8.46   Sodium 135 - 145 mmol/L 135  140  141   Potassium 3.5 - 5.1 mmol/L 3.7  4.5  4.2   Chloride 98 - 111 mmol/L 107  108  106   CO2 22 - 32 mmol/L 21  25  28    Calcium 8.9 - 10.3 mg/dL 8.1  8.8  8.9     ECHOCARDIOGRAM COMPLETE  Result Date: 12/17/2022    ECHOCARDIOGRAM REPORT   Patient Name:   [name] Date of Exam: 12/17/2022 Medical Rec #:  [id number]         Height:       60.0 in Accession #:    [id number]        Weight:       112.0 lb Date of Birth:  October 26, 1950          BSA:          1.459 m Patient Age:    71 years          BP:           124/66 mmHg Patient Gender: F                 HR:           83 bpm. Exam Location:  [facility] Procedure: 2D Echo, Cardiac Doppler and Color Doppler Indications:     Syncope  History:         Patient has prior history of Echocardiogram examinations, most                  recent 02/15/2021. COPD; Signs/Symptoms:Syncope and Murmur.  Sonographer:     Mikki Harbor Referring Phys:  [id number] [facility] GOEL Diagnosing Phys: Yvonne Kendall MD IMPRESSIONS  1. Left ventricular ejection fraction, by estimation, is 65 to 70%. The left ventricle has normal function. The left ventricle has no regional wall motion abnormalities. Left ventricular diastolic parameters are consistent with Grade II diastolic dysfunction (pseudonormalization). Elevated  left atrial pressure.  2. Right ventricular systolic function is normal. The right ventricular size is normal.  3. Left atrial size was mildly dilated.  4. The  mitral valve is normal in structure. Mild mitral valve regurgitation. No evidence of mitral stenosis. Moderate mitral annular calcification.  5. The aortic valve is tricuspid. There is mild thickening of the aortic valve. Aortic valve regurgitation is not visualized. Aortic valve sclerosis is present, with no evidence of aortic valve stenosis.  6. The inferior vena cava is normal in size with greater than 50% respiratory variability, suggesting right atrial pressure of 3 mmHg. FINDINGS  Left Ventricle: Left ventricular ejection fraction, by estimation, is 65 to 70%. The left ventricle has normal function. The left ventricle has no regional wall motion abnormalities. The left ventricular internal cavity size was normal in size. There is  borderline left ventricular hypertrophy. Left ventricular diastolic parameters are consistent with Grade II diastolic dysfunction (pseudonormalization). Elevated left atrial pressure. Right Ventricle: The right ventricular size is normal. No increase in right ventricular wall thickness. Right ventricular systolic function is normal. Left Atrium: Left atrial size was mildly dilated. Right Atrium: Right atrial size was normal in size. Pericardium: Trivial pericardial effusion is present. The pericardial effusion is posterior to the left ventricle. Mitral Valve: The mitral valve is normal in structure. Moderate mitral annular calcification. Mild mitral valve regurgitation. No evidence of mitral valve stenosis. MV peak gradient, 4.3 mmHg. The mean mitral valve gradient is 2.0 mmHg. Tricuspid Valve: The tricuspid valve is normal in structure. Tricuspid valve regurgitation is trivial. Aortic Valve: The aortic valve is tricuspid. There is mild thickening of the aortic valve. Aortic valve regurgitation is not visualized. Aortic valve  sclerosis is present, with no evidence of aortic valve stenosis. Aortic valve mean gradient measures 7.0  mmHg. Aortic valve peak gradient measures 14.3 mmHg. Aortic valve area, by VTI measures 2.55 cm. Pulmonic Valve: The pulmonic valve was normal in structure. Pulmonic valve regurgitation is not visualized. No evidence of pulmonic stenosis. Aorta: The aortic root is normal in size and structure. Pulmonary Artery: The pulmonary artery is of normal size. Venous: The inferior vena cava is normal in size with greater than 50% respiratory variability, suggesting right atrial pressure of 3 mmHg. IAS/Shunts: No atrial level shunt detected by color flow Doppler.  LEFT VENTRICLE PLAX 2D LVIDd:         3.90 cm   Diastology LVIDs:         1.70 cm   LV e' medial:    6.64 cm/s LV PW:         0.80 cm   LV E/e' medial:  15.7 LV IVS:        1.00 cm   LV e' lateral:   9.25 cm/s LVOT diam:     1.90 cm   LV E/e' lateral: 11.2 LV SV:         96 LV SV Index:   65 LVOT Area:     2.84 cm  RIGHT VENTRICLE RV Basal diam:  3.10 cm RV Mid diam:    2.90 cm RV S prime:     13.80 cm/s TAPSE (M-mode): 1.9 cm LEFT ATRIUM             Index        RIGHT ATRIUM           Index LA diam:        4.40 cm 3.02 cm/m   RA Area:     12.90 cm LA Vol (A2C):   48.0 ml 32.90 ml/m  RA Volume:   29.60 ml  20.29 ml/m LA Vol (A4C):   53.7 ml  36.81 ml/m LA Biplane Vol: 52.7 ml 36.12 ml/m  AORTIC VALVE                     PULMONIC VALVE AV Area (Vmax):    2.28 cm      PV Vmax:       0.90 m/s AV Area (Vmean):   2.28 cm      PV Peak grad:  3.2 mmHg AV Area (VTI):     2.55 cm AV Vmax:           189.00 cm/s AV Vmean:          128.000 cm/s AV VTI:            0.374 m AV Peak Grad:      14.3 mmHg AV Mean Grad:      7.0 mmHg LVOT Vmax:         152.00 cm/s LVOT Vmean:        103.000 cm/s LVOT VTI:          0.337 m LVOT/AV VTI ratio: 0.90  AORTA Ao Root diam: 2.90 cm MITRAL VALVE MV Area (PHT): 3.21 cm     SHUNTS MV Area VTI:   3.57 cm     Systemic VTI:  0.34 m  MV Peak grad:  4.3 mmHg     Systemic Diam: 1.90 cm MV Mean grad:  2.0 mmHg MV Vmax:       1.04 m/s MV Vmean:      58.8 cm/s MV Decel Time: 236 msec MV E velocity: 104.00 cm/s MV A velocity: 90.10 cm/s MV E/A ratio:  1.15 Cristal Deer End MD Electronically signed by Yvonne Kendall MD Signature Date/Time: 12/17/2022/1:53:18 PM    Final    US Venous Img Lower Bilateral (DVT)  Result Date: 12/17/2022 CLINICAL DATA:  Bilateral leg swelling. EXAM: BILATERAL LOWER EXTREMITY VENOUS DOPPLER ULTRASOUND TECHNIQUE: Gray-scale sonography with compression, as well as color and duplex ultrasound, were performed to evaluate the deep venous system(s) from the level of the common femoral vein through the popliteal and proximal calf veins. COMPARISON:  None Available. FINDINGS: VENOUS Normal compressibility of the common femoral, superficial femoral, and popliteal veins, as well as the visualized calf veins. Visualized portions of profunda femoral vein and great saphenous vein unremarkable. No filling defects to suggest DVT on grayscale or color Doppler imaging. Doppler waveforms show normal direction of venous flow, normal respiratory plasticity and response to augmentation. OTHER None. Limitations: none IMPRESSION: No evidence for DVT in either lower extremity. Electronically Signed   By: Kennith Center M.D.   On: 12/17/2022 06:54   CT Head Wo Contrast  Result Date: 12/16/2022 CLINICAL DATA:  Fall EXAM: CT HEAD WITHOUT CONTRAST CT CERVICAL SPINE WITHOUT CONTRAST TECHNIQUE: Multidetector CT imaging of the head and cervical spine was performed following the standard protocol without intravenous contrast. Multiplanar CT image reconstructions of the cervical spine were also generated. RADIATION DOSE REDUCTION: This exam was performed according to the departmental dose-optimization program which includes automated exposure control, adjustment of the mA and/or kV according to patient size and/or use of iterative reconstruction  technique. COMPARISON:  CT 12/18/2006, facial CT 09/09/2010 FINDINGS: CT HEAD FINDINGS Brain: No acute territorial infarction, hemorrhage, or intracranial mass is seen. Patchy white matter hypodensity consistent with chronic small vessel ischemic change. Vascular: No hyperdense vessels. Vertebral and carotid vascular calcification. Skull: No fracture. Sinuses/Orbits: Expansile mass in the right ethmoid sinus measuring approximately 2.3 by 1.7 cm with protrusion into the  right orbit, mass touches the medial extraocular muscle. Small fluid level right maxillary sinus. Other: None CT CERVICAL SPINE FINDINGS Alignment: Trace anterolisthesis C7 on T1. Facet alignment is within normal limits. Skull base and vertebrae: No acute fracture. No primary bone lesion or focal pathologic process. Soft tissues and spinal canal: No prevertebral fluid or swelling. No visible canal hematoma. Disc levels: Multilevel degenerative change. Moderate severe degenerative changes C5-C6 and C6-C7. Facet degenerative changes at multiple levels with foraminal narrowing. Upper chest: Emphysema. Other: None IMPRESSION: 1. No CT evidence for acute intracranial abnormality. Mild chronic small vessel ischemic changes of the white matter. 2. Expansile mass in the right ethmoid sinus with protrusion into the right orbit. Findings could be secondary to mucocele or other sinus tumor. Recommend nonemergent MRI for further evaluation as well as ENT consultation. 3. Multilevel degenerative changes of the cervical spine. No acute osseous abnormality. 4. Emphysema. Emphysema (ICD10-J43.9). Electronically Signed   By: Jasmine Pang M.D.   On: 12/16/2022 18:20   CT Cervical Spine Wo Contrast  Result Date: 12/16/2022 CLINICAL DATA:  Fall EXAM: CT HEAD WITHOUT CONTRAST CT CERVICAL SPINE WITHOUT CONTRAST TECHNIQUE: Multidetector CT imaging of the head and cervical spine was performed following the standard protocol without intravenous contrast. Multiplanar CT  image reconstructions of the cervical spine were also generated. RADIATION DOSE REDUCTION: This exam was performed according to the departmental dose-optimization program which includes automated exposure control, adjustment of the mA and/or kV according to patient size and/or use of iterative reconstruction technique. COMPARISON:  CT 12/18/2006, facial CT 09/09/2010 FINDINGS: CT HEAD FINDINGS Brain: No acute territorial infarction, hemorrhage, or intracranial mass is seen. Patchy white matter hypodensity consistent with chronic small vessel ischemic change. Vascular: No hyperdense vessels. Vertebral and carotid vascular calcification. Skull: No fracture. Sinuses/Orbits: Expansile mass in the right ethmoid sinus measuring approximately 2.3 by 1.7 cm with protrusion into the right orbit, mass touches the medial extraocular muscle. Small fluid level right maxillary sinus. Other: None CT CERVICAL SPINE FINDINGS Alignment: Trace anterolisthesis C7 on T1. Facet alignment is within normal limits. Skull base and vertebrae: No acute fracture. No primary bone lesion or focal pathologic process. Soft tissues and spinal canal: No prevertebral fluid or swelling. No visible canal hematoma. Disc levels: Multilevel degenerative change. Moderate severe degenerative changes C5-C6 and C6-C7. Facet degenerative changes at multiple levels with foraminal narrowing. Upper chest: Emphysema. Other: None IMPRESSION: 1. No CT evidence for acute intracranial abnormality. Mild chronic small vessel ischemic changes of the white matter. 2. Expansile mass in the right ethmoid sinus with protrusion into the right orbit. Findings could be secondary to mucocele or other sinus tumor. Recommend nonemergent MRI for further evaluation as well as ENT consultation. 3. Multilevel degenerative changes of the cervical spine. No acute osseous abnormality. 4. Emphysema. Emphysema (ICD10-J43.9). Electronically Signed   By: Jasmine Pang M.D.   On: 12/16/2022  18:20   CT CHEST ABDOMEN PELVIS W CONTRAST  Result Date: 12/16/2022 CLINICAL DATA:  Fall with blunt trauma. X-ray shows multiple rib fractures EXAM: CT CHEST, ABDOMEN, AND PELVIS WITH CONTRAST TECHNIQUE: Multidetector CT imaging of the chest, abdomen and pelvis was performed following the standard protocol during bolus administration of intravenous contrast. RADIATION DOSE REDUCTION: This exam was performed according to the departmental dose-optimization program which includes automated exposure control, adjustment of the mA and/or kV according to patient size and/or use of iterative reconstruction technique. CONTRAST:  OMNIPAQUE IOHEXOL 300 MG/ML  SOLN COMPARISON:  Radiographs 12/16/2022; CT chest 03/05/2022  and CT abdomen and pelvis 10/11/2021 FINDINGS: CT CHEST FINDINGS Cardiovascular: No pericardial effusion. Advanced aortic atherosclerotic calcification. No evidence of acute aortic injury. Coronary artery calcification. Mediastinum/Nodes: Unremarkable esophagus. No thoracic adenopathy. No mediastinal hematoma. Lungs/Pleura: Mild emphysema in the apices. No focal consolidation. No pleural effusion or pneumothorax. Adherent secretions in the trachea extending into the right mainstem bronchus. Musculoskeletal: Advanced arthritis right shoulder. Acute displaced fractures of the left anterior seventh-eighth ribs. CT ABDOMEN PELVIS FINDINGS Hepatobiliary: No hepatic injury or perihepatic hematoma. Gallbladder is unremarkable. Pancreas: Unremarkable. No pancreatic ductal dilatation or surrounding inflammatory changes. Spleen: No splenic injury or perisplenic hematoma. Adrenals/Urinary Tract: No adrenal hemorrhage or renal injury identified. Bladder is unremarkable. Stomach/Bowel: Normal caliber large and small bowel. Colonic diverticulosis without diverticulitis. Stomach is within normal limits. Normal appendix. Vascular/Lymphatic: Aortic atherosclerosis. No enlarged abdominal or pelvic lymph nodes.  Reproductive: Status post hysterectomy. No adnexal masses. Other: No free intraperitoneal fluid or air. Musculoskeletal: Thoracolumbar spondylosis. Posterior fusion L4-L5. Advanced disc space height loss and degenerative endplate changes at L5-S1. No acute fracture. IMPRESSION: 1. Acute displaced fractures of the left anterior 7th-8th ribs. No pneumothorax. 2. No evidence of acute traumatic injury within the abdomen or pelvis. Aortic Atherosclerosis (ICD10-I70.0) and Emphysema (ICD10-J43.9). Electronically Signed   By: Minerva Fester M.D.   On: 12/16/2022 18:17   DG Ribs Unilateral W/Chest Left  Result Date: 12/16/2022 CLINICAL DATA:  Trauma, fall EXAM: LEFT RIBS AND CHEST - 3+ VIEW COMPARISON:  Chest radiograph done on 03/16/2017 FINDINGS: Cardiac size is within normal limits. Thoracic aorta is tortuous. There are no focal pulmonary infiltrates. There is no pleural effusion or pneumothorax. Displaced recent fracture is seen in the anterior left seventh and eighth ribs. Deformities are seen in left fourth, fifth and sixth ribs, possibly residual from previous injury. There is surgical fusion in lower lumbar spine. Degenerative changes are noted in both shoulders, more severe in the right shoulder. IMPRESSION: Displaced recent fractures are seen in the anterior left seventh and eighth ribs. Deformity seen in the left fourth, fifth and sixth ribs without fracture line may suggest old healed fractures. There is no focal pulmonary consolidation. There is no pleural effusion or pneumothorax. Electronically Signed   By: Ernie Avena M.D.   On: 12/16/2022 14:22    Disposition Plan & Communication  Patient status: Inpatient  Admitted From: Home Planned disposition location: Home Anticipated discharge date: 5/23 pending clinical improvement  Family Communication: none at bedside    Author: Leeroy Bock, DO [facility] 12/17/2022, 3:36 PM   Available by Epic secure chat 7AM-7PM. If  7PM-7AM, please contact night-coverage.  [facility] contact information found on [email or web address].

## 2022-12-17 NOTE — Evaluation (Signed)
Physical Therapy Evaluation Patient Details Name: Stephanie Powers MRN: [id number] DOB: [birth year] Today's Date: 12/17/2022  History of Present Illness  72 y/o female presented to ED on 12/16/22 for dizziness and fall hitting L side. Found to have L anterior 7th and 8th rib fx. PMH: COPD, Depression, heart murmur, IBS  Clinical Impression  Patient admitted with the above. PTA, patient is typically independent and on RA. On arrival, patient was on RA with spO2 85% with no increase despite pursed lip breathing. Donned 2L O2 with ability to maintain spO2 92% throughout mobility. Functioning at min guard level with no AD. Complaining of dizziness in standing, see BP below. Patient presents with weakness, impaired balance, and decreased activity tolerance. Patient will benefit from skilled PT services during acute stay to address listed deficits. Patient will benefit from ongoing therapy at discharge to maximize functional independence and safety.   Orthostatic BPs  Sitting 154/90  Standing 137/87  Standing after 2 min 137/73          Recommendations for follow up therapy are one component of a multi-disciplinary discharge planning process, led by the attending physician.  Recommendations may be updated based on patient status, additional functional criteria and insurance authorization.  Follow Up Recommendations       Assistance Recommended at Discharge Intermittent Supervision/Assistance  Patient can return home with the following  A little help with walking and/or transfers;Assistance with cooking/housework;Assist for transportation;Help with stairs or ramp for entrance    Equipment Recommendations None recommended by PT  Recommendations for Other Services       Functional Status Assessment Patient has had a recent decline in their functional status and demonstrates the ability to make significant improvements in function in a reasonable and predictable amount of time.      Precautions / Restrictions Precautions Precautions: Fall Precaution Comments: watch O2 Restrictions Weight Bearing Restrictions: No      Mobility  Bed Mobility Overal bed mobility: Modified Independent                  Transfers Overall transfer level: Needs assistance Equipment used: None Transfers: Sit to/from Stand Sit to Stand: Min guard           General transfer comment: min guard for safety upon standing    Ambulation/Gait Ambulation/Gait assistance: Min guard Gait Distance (Feet): 12 Feet (+6') Assistive device: None Gait Pattern/deviations: Step-through pattern, Decreased stride length Gait velocity: decreased     General Gait Details: initially on RA, spO2 85%. No increase with pursed lip breathing. Donned 2L O2 with increase to 92% and able maintain with short mobility. Patient reporting fatigue with limtied mobility. Reports dizziness in standing with and without O2  Stairs            Wheelchair Mobility    Modified Rankin (Stroke Patients Only)       Balance Overall balance assessment: Mild deficits observed, not formally tested                                           Pertinent Vitals/Pain Pain Assessment Pain Assessment: Faces Faces Pain Scale: Hurts whole lot Pain Location: L ribs Pain Descriptors / Indicators: Discomfort, Grimacing Pain Intervention(s): Monitored during session, Repositioned    Home Living Family/patient expects to be discharged to:: Private residence Living Arrangements: Spouse/significant other Available Help at Discharge: Family Type of Home: House Home  Access: Stairs to enter Entrance Stairs-Rails: Right;Left;Can reach both Entrance Stairs-Number of Steps: 3-4   Home Layout: One level        Prior Function Prior Level of Function : Independent/Modified Independent                     Hand Dominance        Extremity/Trunk Assessment   Upper Extremity  Assessment Upper Extremity Assessment: Overall WFL for tasks assessed    Lower Extremity Assessment Lower Extremity Assessment: Generalized weakness    Cervical / Trunk Assessment Cervical / Trunk Assessment: Normal  Communication   Communication: No difficulties  Cognition Arousal/Alertness: Awake/alert Behavior During Therapy: WFL for tasks assessed/performed Overall Cognitive Status: Within Functional Limits for tasks assessed                                 General Comments: decreased safety awareness with need for oxygen with mobility        General Comments      Exercises     Assessment/Plan    PT Assessment Patient needs continued PT services  PT Problem List Decreased strength;Decreased activity tolerance;Decreased balance;Decreased mobility;Cardiopulmonary status limiting activity;Decreased safety awareness       PT Treatment Interventions DME instruction;Gait training;Functional mobility training;Stair training;Therapeutic activities;Therapeutic exercise;Balance training;Patient/family education    PT Goals (Current goals can be found in the Care Plan section)  Acute Rehab PT Goals Patient Stated Goal: to go home PT Goal Formulation: With patient Time For Goal Achievement: 12/31/22 Potential to Achieve Goals: Good    Frequency Min 2X/week     Co-evaluation               AM-PAC PT "6 Clicks" Mobility  Outcome Measure Help needed turning from your back to your side while in a flat bed without using bedrails?: None Help needed moving from lying on your back to sitting on the side of a flat bed without using bedrails?: None Help needed moving to and from a bed to a chair (including a wheelchair)?: A Little Help needed standing up from a chair using your arms (e.g., wheelchair or bedside chair)?: A Little Help needed to walk in hospital room?: A Little Help needed climbing 3-5 steps with a railing? : A Little 6 Click Score: 20     End of Session Equipment Utilized During Treatment: Oxygen Activity Tolerance: Patient tolerated treatment well Patient left: in bed;with call bell/phone within reach Nurse Communication: Mobility status PT Visit Diagnosis: Unsteadiness on feet (R26.81);Muscle weakness (generalized) (M62.81)    Time: 1025-1040 PT Time Calculation (min) (ACUTE ONLY): 15 min   Charges:   PT Evaluation $PT Eval Low Complexity: 1 Low          Maylon Peppers, PT, DPT Physical Therapist - [place]  [facility]   Bailynn Dyk A Dallan Schonberg 12/17/2022, 11:50 AM

## 2022-12-18 DIAGNOSIS — S2242XA Multiple fractures of ribs, left side, initial encounter for closed fracture: Secondary | ICD-10-CM | POA: Diagnosis not present

## 2022-12-18 DIAGNOSIS — J3489 Other specified disorders of nose and nasal sinuses: Secondary | ICD-10-CM

## 2022-12-18 DIAGNOSIS — R55 Syncope and collapse: Secondary | ICD-10-CM | POA: Diagnosis not present

## 2022-12-18 LAB — BASIC METABOLIC PANEL
Anion gap: 8 (ref 5–15)
BUN: 12 mg/dL (ref 8–23)
CO2: 23 mmol/L (ref 22–32)
Calcium: 8.2 mg/dL — ABNORMAL LOW (ref 8.9–10.3)
Chloride: 108 mmol/L (ref 98–111)
Creatinine, Ser: 0.8 mg/dL (ref 0.44–1.00)
GFR, Estimated: >60 mL/min (ref >60)
Glucose, Bld: 103 mg/dL — ABNORMAL HIGH (ref 70–99)
Potassium: 3.9 mmol/L (ref 3.5–5.1)
Sodium: 139 mmol/L (ref 135–145)

## 2022-12-18 LAB — GLUCOSE, CAPILLARY
Glucose-Capillary: 106 mg/dL — ABNORMAL HIGH (ref 70–99)
Glucose-Capillary: 94 mg/dL (ref 70–99)
Glucose-Capillary: 106 mg/dL — ABNORMAL HIGH (ref 70–99)

## 2022-12-18 MED ORDER — NICOTINE 21 MG/24HR TD PT24: 21.0000 mg | MEDICATED_PATCH | Freq: Every day | TRANSDERMAL | 0 refills | Status: AC

## 2022-12-18 MED ORDER — OXYCODONE HCL 5 MG PO TABS: 5.0000 mg | ORAL_TABLET | Freq: Four times a day (QID) | ORAL | 0 refills | Status: AC | PRN

## 2022-12-18 NOTE — Progress Notes (Signed)
Patient discharged home with oxygen. All belongings sent home with patient. Peripheral IV's removed and discharge instructions/ education provided to patient before discharge.

## 2022-12-18 NOTE — TOC Transition Note (Signed)
Transition of Care [facility]) - CM/SW Discharge Note   Patient Details  Name: Stephanie Powers MRN: [id number] Date of Birth: 02/15/1951  [facility] CM/SW Contact:  Garret Reddish, RN Phone Number: 12/18/2022, 2:40 PM   Clinical Narrative:  Chart reviewed.  Noted that patient will be a discharge for today.  I have spoken with patient .  She reports that prior to admission she lived with a roommate. She reports that she was able to bath and dress herself.  She reports that medications are affordable.  She uses [facility] in [place].  She reports that her PCP is Lonie Peak.    Patient reports that she has a 2 wheeled rolling walker at home.  I have informed patient that provider has ordered home 02.  Patient has also requested a BSC and a cane.  Patient did not have a DME preference.  I have asked Barbara Cower with [employer] to provide patient with home 02, single point cane, and BSC.  [employer] with deliver portable 02 tank, BSC, and cane at bedside prior to patient discharging today.    Patient reports that one of her friends will transport her home today.  Noted PT reports no PT follow up.  I have informed staff nurse of the above information.      Final next level of care: Other (comment) (Home with home 02 and DME) Barriers to Discharge: No Barriers Identified   Patient Goals and CMS Choice CMS Medicare.gov Compare Post Acute Care list provided to:: Patient Choice offered to / list presented to : Patient  Discharge Placement                      Patient and family notified of of transfer: 12/18/22  Discharge Plan and Services Additional resources added to the After Visit Summary for                  DME Arranged: 3-N-1, Oxygen, Cane DME Agency: [employer] Date DME Agency Contacted: 12/18/22 Time DME Agency Contacted: 1100 Representative spoke with at DME Agency: Barbara Cower            Social Determinants of Health (SDOH) Interventions SDOH Screenings   Food Insecurity:  No Food Insecurity (12/17/2022)  Housing: Low Risk  (12/17/2022)  Transportation Needs: No Transportation Needs (12/17/2022)  Utilities: Not At Risk (12/17/2022)  Tobacco Use: High Risk (12/16/2022)     Readmission Risk Interventions     No data to display

## 2022-12-18 NOTE — Discharge Summary (Signed)
Physician Discharge Summary  Patient: Stephanie Powers ZOX:[id number] DOB: 07/05/1951   Code Status: Full Code Admit date: 12/16/2022 Discharge date: 12/18/2022 Disposition: Home health oxygen, No home health services recommended PCP: Lonie Peak, PA-C  Recommendations for Outpatient Follow-up:  Follow up with PCP within 1-2 weeks Regarding general hospital follow up and preventative care Recommend  Stopped protonix for contribution to increased fracture risk Consider further management of anxiety/depression with alternative to trazodone for possible contribution to her night time fall/syncopal episodes and poorly managed anxiety symptoms while inpatient.  Consider pulmonology referral for new oxygen requirement and likely emphysema. She was prescribed nicotine replacement at dc. Follow up with ENT- referral placed at dc Seen on head CT incidental: Expansile mass in the right ethmoid sinus measuring approximately 2.3 by 1.7 cm with protrusion into the right orbit, mass touches the medial extraocular muscle. Small fluid level right maxillary sinus.  Discharge Diagnoses:  Principal Problem:   Syncope Active Problems:   Syncope and collapse   Mass of nasal sinus   Rib fracture  Brief Hospital Course Summary: Stephanie Powers is a 72 y.o. female with a PMH significant for  irritable bowel syndrome with chronic on and off diarrhea with between 3-5 bowel movements a day, arthritis, COPD without chronic oxygen use, depression, GERD, HLD.   They presented from home to the ED on 12/16/2022 with syncopal episode which was unwitnessed. Patient describes getting out of bed and falling to floor with LOC. She woke up unknown time later and was able to get herself up from the floor. Fall was proceeded by dizziness upon standing from bed.    In the ED, it was found that they had stable vital signs on room air.  Significant findings included normal metabolic panel, CBC . Hgb A1c 5.7. urinalysis  small hgb with ketones and many bacteria. Xray and CT chest revealed anterior left 7-8 displaced rib fractures.  Head CT negative for acute intracranial abnormality. Was positive for Expansile mass in the right ethmoid sinus with protrusion into the right orbit. Findings could be secondary to mucocele or other sinus tumor. Recommend nonemergent MRI for further evaluation as well as ENT consultation.   They were initially treated with analgesics. Transiently placed on 2L O2.     Her workup was remarkable for positive orthostatics. Suspect the sinus mass could be contributing to balance issues. Her orthostatic symptoms improved with hydration. She had no abnormalities on telemetry and no further syncopal episodes.  PT/OT evaluated and did not recommend further support needed.   Additionally, patient was found to have a baseline oxygen requirement of 2L [name]. She had no respiratory symptoms, CT chest negative for acute pulmonary pathology. She is a current 1 PPD smoker and may have had hypoxia contributing to her syncopal episode. She was discharged home with oxygen support.   Pain from rib fractures was well controlled on PO medication.   Discharge Condition: Stable, improved Recommended discharge diet: Regular healthy diet  Consultations: None   Procedures/Studies: Started on continuous oxygen  Discharge Instructions     Ambulatory referral to ENT   Complete by: As directed    Referral to ENT for incidental finding on head CT of:  Expansile mass in the right ethmoid sinus measuring approximately 2.3 by 1.7 cm with protrusion into the right orbit, mass touches the medial extraocular muscle. Small fluid level right maxillary sinus.  Specific provider or location desired?  no Put in checkout note to send to referral coordinator  for all locations except [facility].  [facility] will contact the family directly to schedule the referral.      Allergies as of 12/18/2022       Reactions   Other Other  (See Comments)   Allergic to makeup- rash-  Perfume, Hairspray- breathing difficulty and swelling        Medication List     STOP taking these medications    pantoprazole 40 MG tablet Commonly known as: PROTONIX       TAKE these medications    albuterol 108 (90 Base) MCG/ACT inhaler Commonly known as: VENTOLIN HFA Inhale 1-2 puffs into the lungs every 6 (six) hours as needed for wheezing or shortness of breath.   atorvastatin 40 MG tablet Commonly known as: LIPITOR Take 40 mg by mouth every morning.   celecoxib 200 MG capsule Commonly known as: CELEBREX Take 200 mg by mouth daily.   cetirizine 10 MG tablet Commonly known as: ZYRTEC Take 20 mg by mouth daily.   fluticasone 50 MCG/ACT nasal spray Commonly known as: FLONASE Place 1 spray into both nostrils daily.   nicotine 21 mg/24hr patch Commonly known as: NICODERM CQ - dosed in mg/24 hours Place 1 patch (21 mg total) onto the skin daily. Start taking on: Dec 19, 2022   ondansetron 4 MG disintegrating tablet Commonly known as: ZOFRAN-ODT Take 4 mg by mouth 3 (three) times daily as needed.   oxyCODONE 5 MG immediate release tablet Commonly known as: Oxy IR/ROXICODONE Take 1 tablet (5 mg total) by mouth every 6 (six) hours as needed for up to 3 days for moderate pain.   promethazine 25 MG tablet Commonly known as: PHENERGAN Take 12.5-25 mg by mouth 3 (three) times daily as needed.   REFRESH OP Place 1 drop into both eyes every 4 (four) hours as needed (dry eyes).   Salonpas Arthritis Pain Relief Pads Apply 1 each topically daily as needed (pain).   sertraline 100 MG tablet Commonly known as: ZOLOFT Take 150 mg by mouth daily.   traZODone 100 MG tablet Commonly known as: DESYREL Take 100 mg by mouth at bedtime.   Trelegy Ellipta 100-62.5-25 MCG/ACT Aepb Generic drug: Fluticasone-Umeclidin-Vilant Inhale 1 puff into the lungs daily.               Durable Medical Equipment  (From  admission, onward)           Start     Ordered   12/18/22 1015  For home use only DME oxygen  Once       Question Answer Comment  Length of Need 6 Months   Mode or (Route) Nasal cannula   Liters per Minute 2   Frequency Continuous (stationary and portable oxygen unit needed)   Oxygen conserving device Yes   Oxygen delivery system Gas      12/18/22 1015           Subjective   Pt reports feeling improved today. She denies any respiratory distress at rest or with walking. She expresses that she has poorly controlled depression. Rib pain is well controlled with the PO oxy.   All questions and concerns were addressed at time of discharge.  Objective  Blood pressure (!) 153/84, pulse 86, temperature 98.2 F (36.8 C), resp. rate 20, height 5' (1.524 m), weight 50.8 kg, SpO2 94 %.   General: Pt is alert, awake, not in acute distress Cardiovascular: RRR, S1/S2 +, no rubs, no gallops Respiratory: CTA bilaterally, no wheezing, no rhonchi Abdominal:  Soft, NT, ND, bowel sounds + Extremities: no edema, no cyanosis  The results of significant diagnostics from this hospitalization (including imaging, microbiology, ancillary and laboratory) are listed below for reference.   Imaging studies: ECHOCARDIOGRAM COMPLETE  Result Date: 12/17/2022    ECHOCARDIOGRAM REPORT   Patient Name:   IZZIE CHARETTE Date of Exam: 12/17/2022 Medical Rec #:  [id number]         Height:       60.0 in Accession #:    [id number]        Weight:       112.0 lb Date of Birth:  Oct 09, 1950          BSA:          1.459 m Patient Age:    71 years          BP:           124/66 mmHg Patient Gender: F                 HR:           83 bpm. Exam Location:  [facility] Procedure: 2D Echo, Cardiac Doppler and Color Doppler Indications:     Syncope  History:         Patient has prior history of Echocardiogram examinations, most                  recent 02/15/2021. COPD; Signs/Symptoms:Syncope and Murmur.  Sonographer:     Mikki Harbor  Referring Phys:  [id number] [facility] GOEL Diagnosing Phys: Yvonne Kendall MD IMPRESSIONS  1. Left ventricular ejection fraction, by estimation, is 65 to 70%. The left ventricle has normal function. The left ventricle has no regional wall motion abnormalities. Left ventricular diastolic parameters are consistent with Grade II diastolic dysfunction (pseudonormalization). Elevated left atrial pressure.  2. Right ventricular systolic function is normal. The right ventricular size is normal.  3. Left atrial size was mildly dilated.  4. The mitral valve is normal in structure. Mild mitral valve regurgitation. No evidence of mitral stenosis. Moderate mitral annular calcification.  5. The aortic valve is tricuspid. There is mild thickening of the aortic valve. Aortic valve regurgitation is not visualized. Aortic valve sclerosis is present, with no evidence of aortic valve stenosis.  6. The inferior vena cava is normal in size with greater than 50% respiratory variability, suggesting right atrial pressure of 3 mmHg. FINDINGS  Left Ventricle: Left ventricular ejection fraction, by estimation, is 65 to 70%. The left ventricle has normal function. The left ventricle has no regional wall motion abnormalities. The left ventricular internal cavity size was normal in size. There is  borderline left ventricular hypertrophy. Left ventricular diastolic parameters are consistent with Grade II diastolic dysfunction (pseudonormalization). Elevated left atrial pressure. Right Ventricle: The right ventricular size is normal. No increase in right ventricular wall thickness. Right ventricular systolic function is normal. Left Atrium: Left atrial size was mildly dilated. Right Atrium: Right atrial size was normal in size. Pericardium: Trivial pericardial effusion is present. The pericardial effusion is posterior to the left ventricle. Mitral Valve: The mitral valve is normal in structure. Moderate mitral annular calcification. Mild mitral valve  regurgitation. No evidence of mitral valve stenosis. MV peak gradient, 4.3 mmHg. The mean mitral valve gradient is 2.0 mmHg. Tricuspid Valve: The tricuspid valve is normal in structure. Tricuspid valve regurgitation is trivial. Aortic Valve: The aortic valve is tricuspid. There is mild thickening of the aortic valve. Aortic valve regurgitation is not visualized.  Aortic valve sclerosis is present, with no evidence of aortic valve stenosis. Aortic valve mean gradient measures 7.0  mmHg. Aortic valve peak gradient measures 14.3 mmHg. Aortic valve area, by VTI measures 2.55 cm. Pulmonic Valve: The pulmonic valve was normal in structure. Pulmonic valve regurgitation is not visualized. No evidence of pulmonic stenosis. Aorta: The aortic root is normal in size and structure. Pulmonary Artery: The pulmonary artery is of normal size. Venous: The inferior vena cava is normal in size with greater than 50% respiratory variability, suggesting right atrial pressure of 3 mmHg. IAS/Shunts: No atrial level shunt detected by color flow Doppler.  LEFT VENTRICLE PLAX 2D LVIDd:         3.90 cm   Diastology LVIDs:         1.70 cm   LV e' medial:    6.64 cm/s LV PW:         0.80 cm   LV E/e' medial:  15.7 LV IVS:        1.00 cm   LV e' lateral:   9.25 cm/s LVOT diam:     1.90 cm   LV E/e' lateral: 11.2 LV SV:         96 LV SV Index:   65 LVOT Area:     2.84 cm  RIGHT VENTRICLE RV Basal diam:  3.10 cm RV Mid diam:    2.90 cm RV S prime:     13.80 cm/s TAPSE (M-mode): 1.9 cm LEFT ATRIUM             Index        RIGHT ATRIUM           Index LA diam:        4.40 cm 3.02 cm/m   RA Area:     12.90 cm LA Vol (A2C):   48.0 ml 32.90 ml/m  RA Volume:   29.60 ml  20.29 ml/m LA Vol (A4C):   53.7 ml 36.81 ml/m LA Biplane Vol: 52.7 ml 36.12 ml/m  AORTIC VALVE                     PULMONIC VALVE AV Area (Vmax):    2.28 cm      PV Vmax:       0.90 m/s AV Area (Vmean):   2.28 cm      PV Peak grad:  3.2 mmHg AV Area (VTI):     2.55 cm AV Vmax:            189.00 cm/s AV Vmean:          128.000 cm/s AV VTI:            0.374 m AV Peak Grad:      14.3 mmHg AV Mean Grad:      7.0 mmHg LVOT Vmax:         152.00 cm/s LVOT Vmean:        103.000 cm/s LVOT VTI:          0.337 m LVOT/AV VTI ratio: 0.90  AORTA Ao Root diam: 2.90 cm MITRAL VALVE MV Area (PHT): 3.21 cm     SHUNTS MV Area VTI:   3.57 cm     Systemic VTI:  0.34 m MV Peak grad:  4.3 mmHg     Systemic Diam: 1.90 cm MV Mean grad:  2.0 mmHg MV Vmax:       1.04 m/s MV Vmean:      58.8 cm/s MV  Decel Time: 236 msec MV E velocity: 104.00 cm/s MV A velocity: 90.10 cm/s MV E/A ratio:  1.15 Yvonne Kendall MD Electronically signed by Yvonne Kendall MD Signature Date/Time: 12/17/2022/1:53:18 PM    Final    US Venous Img Lower Bilateral (DVT)  Result Date: 12/17/2022 CLINICAL DATA:  Bilateral leg swelling. EXAM: BILATERAL LOWER EXTREMITY VENOUS DOPPLER ULTRASOUND TECHNIQUE: Gray-scale sonography with compression, as well as color and duplex ultrasound, were performed to evaluate the deep venous system(s) from the level of the common femoral vein through the popliteal and proximal calf veins. COMPARISON:  None Available. FINDINGS: VENOUS Normal compressibility of the common femoral, superficial femoral, and popliteal veins, as well as the visualized calf veins. Visualized portions of profunda femoral vein and great saphenous vein unremarkable. No filling defects to suggest DVT on grayscale or color Doppler imaging. Doppler waveforms show normal direction of venous flow, normal respiratory plasticity and response to augmentation. OTHER None. Limitations: none IMPRESSION: No evidence for DVT in either lower extremity. Electronically Signed   By: Kennith Center M.D.   On: 12/17/2022 06:54   CT Head Wo Contrast  Result Date: 12/16/2022 CLINICAL DATA:  Fall EXAM: CT HEAD WITHOUT CONTRAST CT CERVICAL SPINE WITHOUT CONTRAST TECHNIQUE: Multidetector CT imaging of the head and cervical spine was performed following the  standard protocol without intravenous contrast. Multiplanar CT image reconstructions of the cervical spine were also generated. RADIATION DOSE REDUCTION: This exam was performed according to the departmental dose-optimization program which includes automated exposure control, adjustment of the mA and/or kV according to patient size and/or use of iterative reconstruction technique. COMPARISON:  CT 12/18/2006, facial CT 09/09/2010 FINDINGS: CT HEAD FINDINGS Brain: No acute territorial infarction, hemorrhage, or intracranial mass is seen. Patchy white matter hypodensity consistent with chronic small vessel ischemic change. Vascular: No hyperdense vessels. Vertebral and carotid vascular calcification. Skull: No fracture. Sinuses/Orbits: Expansile mass in the right ethmoid sinus measuring approximately 2.3 by 1.7 cm with protrusion into the right orbit, mass touches the medial extraocular muscle. Small fluid level right maxillary sinus. Other: None CT CERVICAL SPINE FINDINGS Alignment: Trace anterolisthesis C7 on T1. Facet alignment is within normal limits. Skull base and vertebrae: No acute fracture. No primary bone lesion or focal pathologic process. Soft tissues and spinal canal: No prevertebral fluid or swelling. No visible canal hematoma. Disc levels: Multilevel degenerative change. Moderate severe degenerative changes C5-C6 and C6-C7. Facet degenerative changes at multiple levels with foraminal narrowing. Upper chest: Emphysema. Other: None IMPRESSION: 1. No CT evidence for acute intracranial abnormality. Mild chronic small vessel ischemic changes of the white matter. 2. Expansile mass in the right ethmoid sinus with protrusion into the right orbit. Findings could be secondary to mucocele or other sinus tumor. Recommend nonemergent MRI for further evaluation as well as ENT consultation. 3. Multilevel degenerative changes of the cervical spine. No acute osseous abnormality. 4. Emphysema. Emphysema (ICD10-J43.9).  Electronically Signed   By: Jasmine Pang M.D.   On: 12/16/2022 18:20   CT Cervical Spine Wo Contrast  Result Date: 12/16/2022 CLINICAL DATA:  Fall EXAM: CT HEAD WITHOUT CONTRAST CT CERVICAL SPINE WITHOUT CONTRAST TECHNIQUE: Multidetector CT imaging of the head and cervical spine was performed following the standard protocol without intravenous contrast. Multiplanar CT image reconstructions of the cervical spine were also generated. RADIATION DOSE REDUCTION: This exam was performed according to the departmental dose-optimization program which includes automated exposure control, adjustment of the mA and/or kV according to patient size and/or use of iterative reconstruction technique. COMPARISON:  CT 12/18/2006, facial CT 09/09/2010 FINDINGS: CT HEAD FINDINGS Brain: No acute territorial infarction, hemorrhage, or intracranial mass is seen. Patchy white matter hypodensity consistent with chronic small vessel ischemic change. Vascular: No hyperdense vessels. Vertebral and carotid vascular calcification. Skull: No fracture. Sinuses/Orbits: Expansile mass in the right ethmoid sinus measuring approximately 2.3 by 1.7 cm with protrusion into the right orbit, mass touches the medial extraocular muscle. Small fluid level right maxillary sinus. Other: None CT CERVICAL SPINE FINDINGS Alignment: Trace anterolisthesis C7 on T1. Facet alignment is within normal limits. Skull base and vertebrae: No acute fracture. No primary bone lesion or focal pathologic process. Soft tissues and spinal canal: No prevertebral fluid or swelling. No visible canal hematoma. Disc levels: Multilevel degenerative change. Moderate severe degenerative changes C5-C6 and C6-C7. Facet degenerative changes at multiple levels with foraminal narrowing. Upper chest: Emphysema. Other: None IMPRESSION: 1. No CT evidence for acute intracranial abnormality. Mild chronic small vessel ischemic changes of the white matter. 2. Expansile mass in the right ethmoid  sinus with protrusion into the right orbit. Findings could be secondary to mucocele or other sinus tumor. Recommend nonemergent MRI for further evaluation as well as ENT consultation. 3. Multilevel degenerative changes of the cervical spine. No acute osseous abnormality. 4. Emphysema. Emphysema (ICD10-J43.9). Electronically Signed   By: Jasmine Pang M.D.   On: 12/16/2022 18:20   CT CHEST ABDOMEN PELVIS W CONTRAST  Result Date: 12/16/2022 CLINICAL DATA:  Fall with blunt trauma. X-ray shows multiple rib fractures EXAM: CT CHEST, ABDOMEN, AND PELVIS WITH CONTRAST TECHNIQUE: Multidetector CT imaging of the chest, abdomen and pelvis was performed following the standard protocol during bolus administration of intravenous contrast. RADIATION DOSE REDUCTION: This exam was performed according to the departmental dose-optimization program which includes automated exposure control, adjustment of the mA and/or kV according to patient size and/or use of iterative reconstruction technique. CONTRAST:  OMNIPAQUE IOHEXOL 300 MG/ML  SOLN COMPARISON:  Radiographs 12/16/2022; CT chest 03/05/2022 and CT abdomen and pelvis 10/11/2021 FINDINGS: CT CHEST FINDINGS Cardiovascular: No pericardial effusion. Advanced aortic atherosclerotic calcification. No evidence of acute aortic injury. Coronary artery calcification. Mediastinum/Nodes: Unremarkable esophagus. No thoracic adenopathy. No mediastinal hematoma. Lungs/Pleura: Mild emphysema in the apices. No focal consolidation. No pleural effusion or pneumothorax. Adherent secretions in the trachea extending into the right mainstem bronchus. Musculoskeletal: Advanced arthritis right shoulder. Acute displaced fractures of the left anterior seventh-eighth ribs. CT ABDOMEN PELVIS FINDINGS Hepatobiliary: No hepatic injury or perihepatic hematoma. Gallbladder is unremarkable. Pancreas: Unremarkable. No pancreatic ductal dilatation or surrounding inflammatory changes. Spleen: No splenic  injury or perisplenic hematoma. Adrenals/Urinary Tract: No adrenal hemorrhage or renal injury identified. Bladder is unremarkable. Stomach/Bowel: Normal caliber large and small bowel. Colonic diverticulosis without diverticulitis. Stomach is within normal limits. Normal appendix. Vascular/Lymphatic: Aortic atherosclerosis. No enlarged abdominal or pelvic lymph nodes. Reproductive: Status post hysterectomy. No adnexal masses. Other: No free intraperitoneal fluid or air. Musculoskeletal: Thoracolumbar spondylosis. Posterior fusion L4-L5. Advanced disc space height loss and degenerative endplate changes at L5-S1. No acute fracture. IMPRESSION: 1. Acute displaced fractures of the left anterior 7th-8th ribs. No pneumothorax. 2. No evidence of acute traumatic injury within the abdomen or pelvis. Aortic Atherosclerosis (ICD10-I70.0) and Emphysema (ICD10-J43.9). Electronically Signed   By: Minerva Fester M.D.   On: 12/16/2022 18:17   DG Ribs Unilateral W/Chest Left  Result Date: 12/16/2022 CLINICAL DATA:  Trauma, fall EXAM: LEFT RIBS AND CHEST - 3+ VIEW COMPARISON:  Chest radiograph done on 03/16/2017 FINDINGS: Cardiac size is within normal  limits. Thoracic aorta is tortuous. There are no focal pulmonary infiltrates. There is no pleural effusion or pneumothorax. Displaced recent fracture is seen in the anterior left seventh and eighth ribs. Deformities are seen in left fourth, fifth and sixth ribs, possibly residual from previous injury. There is surgical fusion in lower lumbar spine. Degenerative changes are noted in both shoulders, more severe in the right shoulder. IMPRESSION: Displaced recent fractures are seen in the anterior left seventh and eighth ribs. Deformity seen in the left fourth, fifth and sixth ribs without fracture line may suggest old healed fractures. There is no focal pulmonary consolidation. There is no pleural effusion or pneumothorax. Electronically Signed   By: Ernie Avena M.D.   On:  12/16/2022 14:22    Labs: Basic Metabolic Panel: Recent Labs  Lab 12/16/22 1353 12/17/22 0545 12/18/22 0636  NA 140 135 139  K 4.5 3.7 3.9  CL 108 107 108  CO2 25 21* 23  GLUCOSE 134* 101* 103*  BUN 17 13 12   CREATININE 0.94 0.72 0.80  CALCIUM 8.8* 8.1* 8.2*   CBC: Recent Labs  Lab 12/16/22 1353 12/17/22 0545  WBC 8.5 9.0  HGB 12.5 11.6*  HCT 39.1 37.8  MCV 93.8 97.2  PLT 215 180   Microbiology: Results for orders placed or performed during the hospital encounter of 03/16/17  Surgical pcr screen     Status: None   Collection Time: 03/16/17  3:23 PM   Specimen: Nasal Mucosa; Nasal Swab  Result Value Ref Range Status   MRSA, PCR NEGATIVE NEGATIVE Final   Staphylococcus aureus NEGATIVE NEGATIVE Final    Comment:        The Xpert SA Assay (FDA approved for NASAL specimens in patients over 24 years of age), is one component of a comprehensive surveillance program.  Test performance has been validated by [facility] for patients greater than or equal to 15 year old. It is not intended to diagnose infection nor to guide or monitor treatment.    Time coordinating discharge: Over 30 minutes  Leeroy Bock, MD  [facility] 12/18/2022, 11:37 AM

## 2022-12-18 NOTE — Progress Notes (Signed)
Mobility Specialist - Progress Note   12/18/22 0952  Mobility  Activity Ambulated with assistance in hallway;Ambulated with assistance to bathroom;Stood at bedside;Dangled on edge of bed  Level of Assistance Contact guard assist, steadying assist  Assistive Device None  Distance Ambulated (ft) 80 ft  Activity Response Tolerated well  Mobility Referral Yes  $Mobility charge 1 Mobility  Mobility Specialist Start Time (ACUTE ONLY) 0931  Mobility Specialist Stop Time (ACUTE ONLY) [id number]  Mobility Specialist Time Calculation (min) (ACUTE ONLY) 21 min   Pt supine in bed on 2L upon arrival. Pt completes bed mobility indep with extra time. Pt STS and ambulates in hallway and to/from bathroom CGA. Pt unsteady and furniture surfs throughout ambulation but no LOB noted. Pt defers usage of RW.  Pt desats to 88 SPO2 by end of session. Pt SPO2 returns to normal with rest. RN notified. Pt returns to bed with needs in reach and bed alarm set.   2L  Pre-mobility: SpO2 =99   RA During mobility: SpO2 = 88 (end of ambulation)   [employer]  Mobility Specialist  12/18/22 9:57 AM

## 2022-12-18 NOTE — Progress Notes (Signed)
Patient is not able to walk the distance required to go the bathroom, or he/she is unable to safely negotiate stairs required to access the bathroom.  A 3in1 BSC will alleviate this problem  

## 2022-12-18 NOTE — Discharge Planning (Signed)
Patient SPO2 at rest (RA) - 095% Patient ambulated in hall on RA (SPO2 88%) and chest tightness. Patient ambulated in hall 2LNC (SPO2 94%). If discharging today, will need O2 at home for ambulation.
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
## 2022-12-18 NOTE — Discharge Instructions (Signed)
Please make an appointment with your primary care provider within the next week to monitor your new oxygen need.  I have prescribed nicotine patches to help in your goal to stop smoking. Make sure to not use cigarettes or flames near your oxygen.  I have filled pain medication for your rib fractures. Please take sparingly as the side effects include constipation, sedation, and can decrease your breathing strength.   Expect to hear from an ENT office to schedule a follow up appointment about the mass seen in your sinus cavity.

## 2022-12-24 DIAGNOSIS — S2249XA Multiple fractures of ribs, unspecified side, initial encounter for closed fracture: Secondary | ICD-10-CM | POA: Diagnosis not present

## 2022-12-24 DIAGNOSIS — J449 Chronic obstructive pulmonary disease, unspecified: Secondary | ICD-10-CM | POA: Diagnosis not present

## 2022-12-24 DIAGNOSIS — J9621 Acute and chronic respiratory failure with hypoxia: Secondary | ICD-10-CM | POA: Diagnosis not present

## 2022-12-24 DIAGNOSIS — R55 Syncope and collapse: Secondary | ICD-10-CM | POA: Diagnosis not present

## 2022-12-24 DIAGNOSIS — K219 Gastro-esophageal reflux disease without esophagitis: Secondary | ICD-10-CM | POA: Diagnosis not present

## 2022-12-24 DIAGNOSIS — J3489 Other specified disorders of nose and nasal sinuses: Secondary | ICD-10-CM | POA: Diagnosis not present

## 2022-12-24 DIAGNOSIS — E2839 Other primary ovarian failure: Secondary | ICD-10-CM | POA: Diagnosis not present

## 2023-01-14 DIAGNOSIS — J3489 Other specified disorders of nose and nasal sinuses: Secondary | ICD-10-CM | POA: Diagnosis not present

## 2023-01-14 DIAGNOSIS — J9621 Acute and chronic respiratory failure with hypoxia: Secondary | ICD-10-CM | POA: Diagnosis not present

## 2023-01-14 DIAGNOSIS — E2839 Other primary ovarian failure: Secondary | ICD-10-CM | POA: Diagnosis not present

## 2023-01-14 DIAGNOSIS — S2249XA Multiple fractures of ribs, unspecified side, initial encounter for closed fracture: Secondary | ICD-10-CM | POA: Diagnosis not present

## 2023-01-14 DIAGNOSIS — J449 Chronic obstructive pulmonary disease, unspecified: Secondary | ICD-10-CM | POA: Diagnosis not present

## 2023-01-19 DIAGNOSIS — M859 Disorder of bone density and structure, unspecified: Secondary | ICD-10-CM | POA: Diagnosis not present

## 2023-01-19 DIAGNOSIS — M8589 Other specified disorders of bone density and structure, multiple sites: Secondary | ICD-10-CM | POA: Diagnosis not present

## 2023-01-19 DIAGNOSIS — E2839 Other primary ovarian failure: Secondary | ICD-10-CM | POA: Diagnosis not present

## 2023-01-20 DIAGNOSIS — J9621 Acute and chronic respiratory failure with hypoxia: Secondary | ICD-10-CM | POA: Diagnosis not present

## 2023-02-18 DIAGNOSIS — J449 Chronic obstructive pulmonary disease, unspecified: Secondary | ICD-10-CM | POA: Diagnosis not present

## 2023-02-23 DIAGNOSIS — K219 Gastro-esophageal reflux disease without esophagitis: Secondary | ICD-10-CM | POA: Diagnosis not present

## 2023-02-23 DIAGNOSIS — Z139 Encounter for screening, unspecified: Secondary | ICD-10-CM | POA: Diagnosis not present

## 2023-02-23 DIAGNOSIS — F3341 Major depressive disorder, recurrent, in partial remission: Secondary | ICD-10-CM | POA: Diagnosis not present

## 2023-02-23 DIAGNOSIS — N182 Chronic kidney disease, stage 2 (mild): Secondary | ICD-10-CM | POA: Diagnosis not present

## 2023-02-23 DIAGNOSIS — J449 Chronic obstructive pulmonary disease, unspecified: Secondary | ICD-10-CM | POA: Diagnosis not present

## 2023-02-23 DIAGNOSIS — R6889 Other general symptoms and signs: Secondary | ICD-10-CM | POA: Diagnosis not present

## 2023-02-23 DIAGNOSIS — M48061 Spinal stenosis, lumbar region without neurogenic claudication: Secondary | ICD-10-CM | POA: Diagnosis not present

## 2023-02-23 DIAGNOSIS — E7800 Pure hypercholesterolemia, unspecified: Secondary | ICD-10-CM | POA: Diagnosis not present

## 2023-02-23 DIAGNOSIS — Z87891 Personal history of nicotine dependence: Secondary | ICD-10-CM | POA: Diagnosis not present

## 2023-02-23 DIAGNOSIS — M8580 Other specified disorders of bone density and structure, unspecified site: Secondary | ICD-10-CM | POA: Diagnosis not present

## 2023-03-21 DIAGNOSIS — J449 Chronic obstructive pulmonary disease, unspecified: Secondary | ICD-10-CM | POA: Diagnosis not present

## 2023-04-21 DIAGNOSIS — J449 Chronic obstructive pulmonary disease, unspecified: Secondary | ICD-10-CM | POA: Diagnosis not present

## 2023-04-28 ENCOUNTER — Other Ambulatory Visit: Payer: Self-pay | Admitting: Physician Assistant

## 2023-04-28 DIAGNOSIS — Z0000 Encounter for general adult medical examination without abnormal findings: Secondary | ICD-10-CM | POA: Diagnosis not present

## 2023-04-28 DIAGNOSIS — Z1211 Encounter for screening for malignant neoplasm of colon: Secondary | ICD-10-CM | POA: Diagnosis not present

## 2023-04-28 DIAGNOSIS — Z9181 History of falling: Secondary | ICD-10-CM | POA: Diagnosis not present

## 2023-04-28 DIAGNOSIS — Z1331 Encounter for screening for depression: Secondary | ICD-10-CM | POA: Diagnosis not present

## 2023-04-28 DIAGNOSIS — Z5982 Transportation insecurity: Secondary | ICD-10-CM | POA: Diagnosis not present

## 2023-04-28 DIAGNOSIS — Z139 Encounter for screening, unspecified: Secondary | ICD-10-CM | POA: Diagnosis not present

## 2023-04-28 DIAGNOSIS — Z1231 Encounter for screening mammogram for malignant neoplasm of breast: Secondary | ICD-10-CM

## 2023-05-21 DIAGNOSIS — J449 Chronic obstructive pulmonary disease, unspecified: Secondary | ICD-10-CM | POA: Diagnosis not present

## 2023-05-25 DIAGNOSIS — H35371 Puckering of macula, right eye: Secondary | ICD-10-CM | POA: Diagnosis not present

## 2023-05-25 DIAGNOSIS — H25812 Combined forms of age-related cataract, left eye: Secondary | ICD-10-CM | POA: Diagnosis not present

## 2023-05-25 DIAGNOSIS — Z961 Presence of intraocular lens: Secondary | ICD-10-CM | POA: Diagnosis not present

## 2023-05-25 DIAGNOSIS — H3581 Retinal edema: Secondary | ICD-10-CM | POA: Diagnosis not present

## 2023-06-02 DIAGNOSIS — H25812 Combined forms of age-related cataract, left eye: Secondary | ICD-10-CM | POA: Diagnosis not present

## 2023-06-21 DIAGNOSIS — J449 Chronic obstructive pulmonary disease, unspecified: Secondary | ICD-10-CM | POA: Diagnosis not present

## 2023-07-18 ENCOUNTER — Emergency Department
Admission: EM | Admit: 2023-07-18 | Discharge: 2023-07-18 | Disposition: A | Discharge status: Home / Self Care | Payer: No Typology Code available for payment source | Attending: Emergency Medicine | Admitting: Emergency Medicine

## 2023-07-18 ENCOUNTER — Encounter: Payer: Self-pay | Admitting: Pharmacy Technician

## 2023-07-18 ENCOUNTER — Other Ambulatory Visit: Payer: Self-pay

## 2023-07-18 DIAGNOSIS — Z043 Encounter for examination and observation following other accident: Secondary | ICD-10-CM | POA: Insufficient documentation

## 2023-07-18 DIAGNOSIS — Z041 Encounter for examination and observation following transport accident: Secondary | ICD-10-CM | POA: Diagnosis not present

## 2023-07-18 DIAGNOSIS — Y92410 Unspecified street and highway as the place of occurrence of the external cause: Secondary | ICD-10-CM | POA: Insufficient documentation

## 2023-07-18 DIAGNOSIS — J449 Chronic obstructive pulmonary disease, unspecified: Secondary | ICD-10-CM | POA: Insufficient documentation
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
## 2023-07-18 NOTE — Discharge Instructions (Addendum)
You can take 650 mg of Tylenol and 600 mg of ibuprofen every 6 hours as needed for pain.  You can use ice and heat as well as topical pain relievers like the salonpas patches.   Please return to the ED with any new or worsening symptoms like chest pain, shortness of breath, abdominal pain or bruising.
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
## 2023-07-21 DIAGNOSIS — J449 Chronic obstructive pulmonary disease, unspecified: Secondary | ICD-10-CM | POA: Diagnosis not present

## 2023-08-21 DIAGNOSIS — J449 Chronic obstructive pulmonary disease, unspecified: Secondary | ICD-10-CM | POA: Diagnosis not present

## 2023-08-26 DIAGNOSIS — E7800 Pure hypercholesterolemia, unspecified: Secondary | ICD-10-CM | POA: Diagnosis not present

## 2023-08-26 DIAGNOSIS — J449 Chronic obstructive pulmonary disease, unspecified: Secondary | ICD-10-CM | POA: Diagnosis not present

## 2023-08-26 DIAGNOSIS — J019 Acute sinusitis, unspecified: Secondary | ICD-10-CM | POA: Diagnosis not present

## 2023-08-26 DIAGNOSIS — L309 Dermatitis, unspecified: Secondary | ICD-10-CM | POA: Diagnosis not present

## 2023-08-26 DIAGNOSIS — Z23 Encounter for immunization: Secondary | ICD-10-CM | POA: Diagnosis not present

## 2023-08-26 DIAGNOSIS — M8580 Other specified disorders of bone density and structure, unspecified site: Secondary | ICD-10-CM | POA: Diagnosis not present

## 2023-08-26 DIAGNOSIS — N182 Chronic kidney disease, stage 2 (mild): Secondary | ICD-10-CM | POA: Diagnosis not present

## 2023-08-26 DIAGNOSIS — F3341 Major depressive disorder, recurrent, in partial remission: Secondary | ICD-10-CM | POA: Diagnosis not present

## 2023-08-26 DIAGNOSIS — M48061 Spinal stenosis, lumbar region without neurogenic claudication: Secondary | ICD-10-CM | POA: Diagnosis not present

## 2023-08-26 DIAGNOSIS — J309 Allergic rhinitis, unspecified: Secondary | ICD-10-CM | POA: Diagnosis not present

## 2023-08-26 DIAGNOSIS — K219 Gastro-esophageal reflux disease without esophagitis: Secondary | ICD-10-CM | POA: Diagnosis not present

## 2023-08-26 DIAGNOSIS — R634 Abnormal weight loss: Secondary | ICD-10-CM | POA: Diagnosis not present

## 2023-09-07 DIAGNOSIS — H40051 Ocular hypertension, right eye: Secondary | ICD-10-CM | POA: Diagnosis not present

## 2023-09-07 DIAGNOSIS — H353131 Nonexudative age-related macular degeneration, bilateral, early dry stage: Secondary | ICD-10-CM | POA: Diagnosis not present

## 2023-09-07 DIAGNOSIS — Z961 Presence of intraocular lens: Secondary | ICD-10-CM | POA: Diagnosis not present

## 2023-09-07 DIAGNOSIS — H3581 Retinal edema: Secondary | ICD-10-CM | POA: Diagnosis not present

## 2023-09-21 DIAGNOSIS — H401112 Primary open-angle glaucoma, right eye, moderate stage: Secondary | ICD-10-CM | POA: Diagnosis not present

## 2023-09-21 DIAGNOSIS — H353121 Nonexudative age-related macular degeneration, left eye, early dry stage: Secondary | ICD-10-CM | POA: Diagnosis not present

## 2023-09-21 DIAGNOSIS — J449 Chronic obstructive pulmonary disease, unspecified: Secondary | ICD-10-CM | POA: Diagnosis not present

## 2023-09-21 DIAGNOSIS — H35371 Puckering of macula, right eye: Secondary | ICD-10-CM | POA: Diagnosis not present

## 2023-09-21 DIAGNOSIS — H2512 Age-related nuclear cataract, left eye: Secondary | ICD-10-CM | POA: Diagnosis not present

## 2023-09-21 DIAGNOSIS — H35351 Cystoid macular degeneration, right eye: Secondary | ICD-10-CM | POA: Diagnosis not present

## 2023-09-21 DIAGNOSIS — H02833 Dermatochalasis of right eye, unspecified eyelid: Secondary | ICD-10-CM | POA: Diagnosis not present

## 2023-09-21 DIAGNOSIS — H353112 Nonexudative age-related macular degeneration, right eye, intermediate dry stage: Secondary | ICD-10-CM | POA: Diagnosis not present

## 2023-10-02 DIAGNOSIS — E785 Hyperlipidemia, unspecified: Secondary | ICD-10-CM | POA: Diagnosis not present

## 2023-10-02 DIAGNOSIS — F17210 Nicotine dependence, cigarettes, uncomplicated: Secondary | ICD-10-CM | POA: Diagnosis not present

## 2023-10-02 DIAGNOSIS — R2681 Unsteadiness on feet: Secondary | ICD-10-CM | POA: Diagnosis not present

## 2023-10-02 DIAGNOSIS — K219 Gastro-esophageal reflux disease without esophagitis: Secondary | ICD-10-CM | POA: Diagnosis not present

## 2023-10-02 DIAGNOSIS — F321 Major depressive disorder, single episode, moderate: Secondary | ICD-10-CM | POA: Diagnosis not present

## 2023-10-02 DIAGNOSIS — H4010X0 Unspecified open-angle glaucoma, stage unspecified: Secondary | ICD-10-CM | POA: Diagnosis not present

## 2023-10-02 DIAGNOSIS — Z008 Encounter for other general examination: Secondary | ICD-10-CM | POA: Diagnosis not present

## 2023-10-02 DIAGNOSIS — R45851 Suicidal ideations: Secondary | ICD-10-CM | POA: Diagnosis not present

## 2023-10-02 DIAGNOSIS — J449 Chronic obstructive pulmonary disease, unspecified: Secondary | ICD-10-CM | POA: Diagnosis not present

## 2023-10-19 DIAGNOSIS — J449 Chronic obstructive pulmonary disease, unspecified: Secondary | ICD-10-CM | POA: Diagnosis not present

## 2023-11-09 DIAGNOSIS — J019 Acute sinusitis, unspecified: Secondary | ICD-10-CM | POA: Diagnosis not present

## 2023-11-19 DIAGNOSIS — J449 Chronic obstructive pulmonary disease, unspecified: Secondary | ICD-10-CM | POA: Diagnosis not present

## 2023-11-30 DIAGNOSIS — H3581 Retinal edema: Secondary | ICD-10-CM | POA: Diagnosis not present

## 2023-11-30 DIAGNOSIS — H353131 Nonexudative age-related macular degeneration, bilateral, early dry stage: Secondary | ICD-10-CM | POA: Diagnosis not present

## 2023-11-30 DIAGNOSIS — H40051 Ocular hypertension, right eye: Secondary | ICD-10-CM | POA: Diagnosis not present

## 2023-11-30 DIAGNOSIS — Z961 Presence of intraocular lens: Secondary | ICD-10-CM | POA: Diagnosis not present

## 2023-12-25 DIAGNOSIS — J449 Chronic obstructive pulmonary disease, unspecified: Secondary | ICD-10-CM | POA: Diagnosis not present

## 2023-12-25 DIAGNOSIS — K219 Gastro-esophageal reflux disease without esophagitis: Secondary | ICD-10-CM | POA: Diagnosis not present

## 2023-12-25 DIAGNOSIS — Z87891 Personal history of nicotine dependence: Secondary | ICD-10-CM | POA: Diagnosis not present

## 2023-12-25 DIAGNOSIS — N182 Chronic kidney disease, stage 2 (mild): Secondary | ICD-10-CM | POA: Diagnosis not present

## 2023-12-25 DIAGNOSIS — M48061 Spinal stenosis, lumbar region without neurogenic claudication: Secondary | ICD-10-CM | POA: Diagnosis not present

## 2023-12-25 DIAGNOSIS — J309 Allergic rhinitis, unspecified: Secondary | ICD-10-CM | POA: Diagnosis not present

## 2023-12-25 DIAGNOSIS — F3341 Major depressive disorder, recurrent, in partial remission: Secondary | ICD-10-CM | POA: Diagnosis not present

## 2023-12-25 DIAGNOSIS — M8580 Other specified disorders of bone density and structure, unspecified site: Secondary | ICD-10-CM | POA: Diagnosis not present

## 2023-12-25 DIAGNOSIS — E7800 Pure hypercholesterolemia, unspecified: Secondary | ICD-10-CM | POA: Diagnosis not present

## 2023-12-29 ENCOUNTER — Other Ambulatory Visit: Payer: Self-pay | Admitting: Physician Assistant

## 2023-12-29 DIAGNOSIS — Z87891 Personal history of nicotine dependence: Secondary | ICD-10-CM

## 2024-04-05 DIAGNOSIS — R6889 Other general symptoms and signs: Secondary | ICD-10-CM | POA: Diagnosis not present

## 2024-04-05 DIAGNOSIS — R42 Dizziness and giddiness: Secondary | ICD-10-CM | POA: Diagnosis not present

## 2024-04-05 DIAGNOSIS — J449 Chronic obstructive pulmonary disease, unspecified: Secondary | ICD-10-CM | POA: Diagnosis not present

## 2024-04-05 DIAGNOSIS — J329 Chronic sinusitis, unspecified: Secondary | ICD-10-CM | POA: Diagnosis not present

## 2024-04-25 DIAGNOSIS — H401112 Primary open-angle glaucoma, right eye, moderate stage: Secondary | ICD-10-CM | POA: Diagnosis not present

## 2024-04-25 DIAGNOSIS — H353122 Nonexudative age-related macular degeneration, left eye, intermediate dry stage: Secondary | ICD-10-CM | POA: Diagnosis not present

## 2024-04-25 DIAGNOSIS — H353112 Nonexudative age-related macular degeneration, right eye, intermediate dry stage: Secondary | ICD-10-CM | POA: Diagnosis not present

## 2024-04-25 DIAGNOSIS — H2512 Age-related nuclear cataract, left eye: Secondary | ICD-10-CM | POA: Diagnosis not present

## 2024-04-25 DIAGNOSIS — H35371 Puckering of macula, right eye: Secondary | ICD-10-CM | POA: Diagnosis not present

## 2024-04-25 DIAGNOSIS — H35351 Cystoid macular degeneration, right eye: Secondary | ICD-10-CM | POA: Diagnosis not present

## 2024-04-29 DIAGNOSIS — J449 Chronic obstructive pulmonary disease, unspecified: Secondary | ICD-10-CM | POA: Diagnosis not present

## 2024-04-29 DIAGNOSIS — Z87891 Personal history of nicotine dependence: Secondary | ICD-10-CM | POA: Diagnosis not present

## 2024-04-29 DIAGNOSIS — J309 Allergic rhinitis, unspecified: Secondary | ICD-10-CM | POA: Diagnosis not present

## 2024-04-29 DIAGNOSIS — K219 Gastro-esophageal reflux disease without esophagitis: Secondary | ICD-10-CM | POA: Diagnosis not present

## 2024-04-29 DIAGNOSIS — E7800 Pure hypercholesterolemia, unspecified: Secondary | ICD-10-CM | POA: Diagnosis not present

## 2024-04-29 DIAGNOSIS — N1831 Chronic kidney disease, stage 3a: Secondary | ICD-10-CM | POA: Diagnosis not present

## 2024-04-29 DIAGNOSIS — Z23 Encounter for immunization: Secondary | ICD-10-CM | POA: Diagnosis not present

## 2024-04-29 DIAGNOSIS — M8580 Other specified disorders of bone density and structure, unspecified site: Secondary | ICD-10-CM | POA: Diagnosis not present

## 2024-04-29 DIAGNOSIS — M48061 Spinal stenosis, lumbar region without neurogenic claudication: Secondary | ICD-10-CM | POA: Diagnosis not present

## 2024-04-29 DIAGNOSIS — Z1231 Encounter for screening mammogram for malignant neoplasm of breast: Secondary | ICD-10-CM | POA: Diagnosis not present

## 2024-04-29 DIAGNOSIS — F3341 Major depressive disorder, recurrent, in partial remission: Secondary | ICD-10-CM | POA: Diagnosis not present

## 2024-05-02 ENCOUNTER — Other Ambulatory Visit: Payer: Self-pay | Admitting: Physician Assistant

## 2024-05-02 DIAGNOSIS — Z1231 Encounter for screening mammogram for malignant neoplasm of breast: Secondary | ICD-10-CM

## 2024-05-02 DIAGNOSIS — Z87891 Personal history of nicotine dependence: Secondary | ICD-10-CM
